# Patient Record
Sex: Male | Born: 1950 | Race: White | Hispanic: No | Marital: Married | Smoking: Never smoker
Health system: Southern US, Community
[De-identification: ages and names within clinical notes are randomized; demographics above are authoritative.]

## PROBLEM LIST (undated history)

## (undated) DIAGNOSIS — I10 Essential (primary) hypertension: Secondary | ICD-10-CM

## (undated) DIAGNOSIS — E785 Hyperlipidemia, unspecified: Secondary | ICD-10-CM

## (undated) DIAGNOSIS — E119 Type 2 diabetes mellitus without complications: Secondary | ICD-10-CM

---

## 2001-10-03 ENCOUNTER — Emergency Department (HOSPITAL_COMMUNITY): Admission: EM | Admit: 2001-10-03 | Discharge: 2001-10-03 | Payer: Self-pay

## 2015-05-16 DIAGNOSIS — E669 Obesity, unspecified: Secondary | ICD-10-CM | POA: Diagnosis not present

## 2015-05-16 DIAGNOSIS — M1990 Unspecified osteoarthritis, unspecified site: Secondary | ICD-10-CM | POA: Diagnosis not present

## 2015-05-16 DIAGNOSIS — E7800 Pure hypercholesterolemia, unspecified: Secondary | ICD-10-CM | POA: Diagnosis not present

## 2015-05-16 DIAGNOSIS — R739 Hyperglycemia, unspecified: Secondary | ICD-10-CM | POA: Diagnosis not present

## 2015-05-16 DIAGNOSIS — I10 Essential (primary) hypertension: Secondary | ICD-10-CM | POA: Diagnosis not present

## 2015-07-12 DIAGNOSIS — M1712 Unilateral primary osteoarthritis, left knee: Secondary | ICD-10-CM | POA: Diagnosis not present

## 2015-07-12 DIAGNOSIS — M1711 Unilateral primary osteoarthritis, right knee: Secondary | ICD-10-CM | POA: Diagnosis not present

## 2015-07-12 DIAGNOSIS — M170 Bilateral primary osteoarthritis of knee: Secondary | ICD-10-CM | POA: Diagnosis not present

## 2015-07-18 DIAGNOSIS — I10 Essential (primary) hypertension: Secondary | ICD-10-CM | POA: Diagnosis not present

## 2015-07-18 DIAGNOSIS — E7800 Pure hypercholesterolemia, unspecified: Secondary | ICD-10-CM | POA: Diagnosis not present

## 2015-07-18 DIAGNOSIS — E119 Type 2 diabetes mellitus without complications: Secondary | ICD-10-CM | POA: Diagnosis not present

## 2015-07-18 DIAGNOSIS — Z7984 Long term (current) use of oral hypoglycemic drugs: Secondary | ICD-10-CM | POA: Diagnosis not present

## 2015-07-18 DIAGNOSIS — M1712 Unilateral primary osteoarthritis, left knee: Secondary | ICD-10-CM | POA: Diagnosis not present

## 2015-09-11 DIAGNOSIS — Z7984 Long term (current) use of oral hypoglycemic drugs: Secondary | ICD-10-CM | POA: Diagnosis not present

## 2015-09-11 DIAGNOSIS — E119 Type 2 diabetes mellitus without complications: Secondary | ICD-10-CM | POA: Diagnosis not present

## 2015-09-11 DIAGNOSIS — M1712 Unilateral primary osteoarthritis, left knee: Secondary | ICD-10-CM | POA: Diagnosis not present

## 2015-09-11 DIAGNOSIS — E7800 Pure hypercholesterolemia, unspecified: Secondary | ICD-10-CM | POA: Diagnosis not present

## 2015-09-11 DIAGNOSIS — I10 Essential (primary) hypertension: Secondary | ICD-10-CM | POA: Diagnosis not present

## 2016-01-07 DIAGNOSIS — K5900 Constipation, unspecified: Secondary | ICD-10-CM | POA: Diagnosis not present

## 2016-01-13 DIAGNOSIS — E119 Type 2 diabetes mellitus without complications: Secondary | ICD-10-CM | POA: Diagnosis not present

## 2016-01-13 DIAGNOSIS — Z7984 Long term (current) use of oral hypoglycemic drugs: Secondary | ICD-10-CM | POA: Diagnosis not present

## 2016-01-13 DIAGNOSIS — K5909 Other constipation: Secondary | ICD-10-CM | POA: Diagnosis not present

## 2016-01-13 DIAGNOSIS — Z23 Encounter for immunization: Secondary | ICD-10-CM | POA: Diagnosis not present

## 2016-01-13 DIAGNOSIS — Z125 Encounter for screening for malignant neoplasm of prostate: Secondary | ICD-10-CM | POA: Diagnosis not present

## 2016-01-13 DIAGNOSIS — E7800 Pure hypercholesterolemia, unspecified: Secondary | ICD-10-CM | POA: Diagnosis not present

## 2016-01-13 DIAGNOSIS — M1712 Unilateral primary osteoarthritis, left knee: Secondary | ICD-10-CM | POA: Diagnosis not present

## 2016-01-13 DIAGNOSIS — I10 Essential (primary) hypertension: Secondary | ICD-10-CM | POA: Diagnosis not present

## 2016-05-14 DIAGNOSIS — I10 Essential (primary) hypertension: Secondary | ICD-10-CM | POA: Diagnosis not present

## 2016-05-14 DIAGNOSIS — E119 Type 2 diabetes mellitus without complications: Secondary | ICD-10-CM | POA: Diagnosis not present

## 2016-05-14 DIAGNOSIS — Z23 Encounter for immunization: Secondary | ICD-10-CM | POA: Diagnosis not present

## 2016-05-14 DIAGNOSIS — E7800 Pure hypercholesterolemia, unspecified: Secondary | ICD-10-CM | POA: Diagnosis not present

## 2016-11-12 DIAGNOSIS — E119 Type 2 diabetes mellitus without complications: Secondary | ICD-10-CM | POA: Diagnosis not present

## 2016-11-12 DIAGNOSIS — Z6834 Body mass index (BMI) 34.0-34.9, adult: Secondary | ICD-10-CM | POA: Diagnosis not present

## 2016-11-12 DIAGNOSIS — E7800 Pure hypercholesterolemia, unspecified: Secondary | ICD-10-CM | POA: Diagnosis not present

## 2016-11-12 DIAGNOSIS — I10 Essential (primary) hypertension: Secondary | ICD-10-CM | POA: Diagnosis not present

## 2016-11-17 DIAGNOSIS — E119 Type 2 diabetes mellitus without complications: Secondary | ICD-10-CM | POA: Diagnosis not present

## 2016-11-17 DIAGNOSIS — I10 Essential (primary) hypertension: Secondary | ICD-10-CM | POA: Diagnosis not present

## 2016-11-17 DIAGNOSIS — Z23 Encounter for immunization: Secondary | ICD-10-CM | POA: Diagnosis not present

## 2016-11-17 DIAGNOSIS — Z6835 Body mass index (BMI) 35.0-35.9, adult: Secondary | ICD-10-CM | POA: Diagnosis not present

## 2016-12-16 DIAGNOSIS — Z6835 Body mass index (BMI) 35.0-35.9, adult: Secondary | ICD-10-CM | POA: Diagnosis not present

## 2016-12-16 DIAGNOSIS — Z23 Encounter for immunization: Secondary | ICD-10-CM | POA: Diagnosis not present

## 2016-12-16 DIAGNOSIS — E119 Type 2 diabetes mellitus without complications: Secondary | ICD-10-CM | POA: Diagnosis not present

## 2016-12-16 DIAGNOSIS — I10 Essential (primary) hypertension: Secondary | ICD-10-CM | POA: Diagnosis not present

## 2016-12-18 DIAGNOSIS — E119 Type 2 diabetes mellitus without complications: Secondary | ICD-10-CM | POA: Diagnosis not present

## 2016-12-18 DIAGNOSIS — I10 Essential (primary) hypertension: Secondary | ICD-10-CM | POA: Diagnosis not present

## 2016-12-18 DIAGNOSIS — Z6832 Body mass index (BMI) 32.0-32.9, adult: Secondary | ICD-10-CM | POA: Diagnosis not present

## 2016-12-18 DIAGNOSIS — E669 Obesity, unspecified: Secondary | ICD-10-CM | POA: Diagnosis not present

## 2017-03-14 DIAGNOSIS — H9201 Otalgia, right ear: Secondary | ICD-10-CM | POA: Diagnosis not present

## 2017-03-14 DIAGNOSIS — K0510 Chronic gingivitis, plaque induced: Secondary | ICD-10-CM | POA: Diagnosis not present

## 2017-06-02 DIAGNOSIS — H1031 Unspecified acute conjunctivitis, right eye: Secondary | ICD-10-CM | POA: Diagnosis not present

## 2017-06-09 DIAGNOSIS — E119 Type 2 diabetes mellitus without complications: Secondary | ICD-10-CM | POA: Diagnosis not present

## 2017-06-09 DIAGNOSIS — E7800 Pure hypercholesterolemia, unspecified: Secondary | ICD-10-CM | POA: Diagnosis not present

## 2017-06-09 DIAGNOSIS — R748 Abnormal levels of other serum enzymes: Secondary | ICD-10-CM | POA: Diagnosis not present

## 2017-06-09 DIAGNOSIS — I10 Essential (primary) hypertension: Secondary | ICD-10-CM | POA: Diagnosis not present

## 2017-06-10 DIAGNOSIS — I10 Essential (primary) hypertension: Secondary | ICD-10-CM | POA: Diagnosis not present

## 2017-06-10 DIAGNOSIS — E119 Type 2 diabetes mellitus without complications: Secondary | ICD-10-CM | POA: Diagnosis not present

## 2017-06-10 DIAGNOSIS — E7800 Pure hypercholesterolemia, unspecified: Secondary | ICD-10-CM | POA: Diagnosis not present

## 2017-06-10 DIAGNOSIS — E669 Obesity, unspecified: Secondary | ICD-10-CM | POA: Diagnosis not present

## 2017-11-23 DIAGNOSIS — H524 Presbyopia: Secondary | ICD-10-CM | POA: Diagnosis not present

## 2017-12-13 DIAGNOSIS — E119 Type 2 diabetes mellitus without complications: Secondary | ICD-10-CM | POA: Diagnosis not present

## 2017-12-13 DIAGNOSIS — E7800 Pure hypercholesterolemia, unspecified: Secondary | ICD-10-CM | POA: Diagnosis not present

## 2017-12-13 DIAGNOSIS — Z1159 Encounter for screening for other viral diseases: Secondary | ICD-10-CM | POA: Diagnosis not present

## 2017-12-13 DIAGNOSIS — I10 Essential (primary) hypertension: Secondary | ICD-10-CM | POA: Diagnosis not present

## 2018-06-16 DIAGNOSIS — E119 Type 2 diabetes mellitus without complications: Secondary | ICD-10-CM | POA: Diagnosis not present

## 2018-06-16 DIAGNOSIS — E7800 Pure hypercholesterolemia, unspecified: Secondary | ICD-10-CM | POA: Diagnosis not present

## 2018-06-16 DIAGNOSIS — I10 Essential (primary) hypertension: Secondary | ICD-10-CM | POA: Diagnosis not present

## 2019-04-06 ENCOUNTER — Ambulatory Visit: Payer: Medicare Other

## 2019-04-23 ENCOUNTER — Ambulatory Visit: Payer: Medicare Other

## 2020-03-14 DIAGNOSIS — Z7984 Long term (current) use of oral hypoglycemic drugs: Secondary | ICD-10-CM | POA: Diagnosis not present

## 2020-03-14 DIAGNOSIS — E7800 Pure hypercholesterolemia, unspecified: Secondary | ICD-10-CM | POA: Diagnosis not present

## 2020-03-14 DIAGNOSIS — E1165 Type 2 diabetes mellitus with hyperglycemia: Secondary | ICD-10-CM | POA: Diagnosis not present

## 2020-03-14 DIAGNOSIS — I10 Essential (primary) hypertension: Secondary | ICD-10-CM | POA: Diagnosis not present

## 2020-03-14 DIAGNOSIS — M1712 Unilateral primary osteoarthritis, left knee: Secondary | ICD-10-CM | POA: Diagnosis not present

## 2020-07-15 DIAGNOSIS — Z1211 Encounter for screening for malignant neoplasm of colon: Secondary | ICD-10-CM | POA: Diagnosis not present

## 2020-07-15 DIAGNOSIS — M1712 Unilateral primary osteoarthritis, left knee: Secondary | ICD-10-CM | POA: Diagnosis not present

## 2020-07-15 DIAGNOSIS — I10 Essential (primary) hypertension: Secondary | ICD-10-CM | POA: Diagnosis not present

## 2020-07-15 DIAGNOSIS — E7800 Pure hypercholesterolemia, unspecified: Secondary | ICD-10-CM | POA: Diagnosis not present

## 2020-07-15 DIAGNOSIS — E1165 Type 2 diabetes mellitus with hyperglycemia: Secondary | ICD-10-CM | POA: Diagnosis not present

## 2020-08-13 DIAGNOSIS — M1712 Unilateral primary osteoarthritis, left knee: Secondary | ICD-10-CM | POA: Diagnosis not present

## 2020-08-13 DIAGNOSIS — I10 Essential (primary) hypertension: Secondary | ICD-10-CM | POA: Diagnosis not present

## 2020-08-13 DIAGNOSIS — E1165 Type 2 diabetes mellitus with hyperglycemia: Secondary | ICD-10-CM | POA: Diagnosis not present

## 2020-08-13 DIAGNOSIS — E7800 Pure hypercholesterolemia, unspecified: Secondary | ICD-10-CM | POA: Diagnosis not present

## 2020-08-13 DIAGNOSIS — Z1211 Encounter for screening for malignant neoplasm of colon: Secondary | ICD-10-CM | POA: Diagnosis not present

## 2020-08-27 DIAGNOSIS — E1165 Type 2 diabetes mellitus with hyperglycemia: Secondary | ICD-10-CM | POA: Diagnosis not present

## 2020-09-03 DIAGNOSIS — M25561 Pain in right knee: Secondary | ICD-10-CM | POA: Diagnosis not present

## 2020-09-03 DIAGNOSIS — M25562 Pain in left knee: Secondary | ICD-10-CM | POA: Diagnosis not present

## 2020-09-06 ENCOUNTER — Other Ambulatory Visit: Payer: Self-pay | Admitting: Sports Medicine

## 2020-09-06 ENCOUNTER — Other Ambulatory Visit: Payer: Self-pay

## 2020-09-06 ENCOUNTER — Ambulatory Visit
Admission: RE | Admit: 2020-09-06 | Discharge: 2020-09-06 | Disposition: A | Discharge status: Home / Self Care | Payer: Medicare Other | Source: Ambulatory Visit | Attending: Sports Medicine | Admitting: Sports Medicine

## 2020-09-06 DIAGNOSIS — M25561 Pain in right knee: Secondary | ICD-10-CM | POA: Diagnosis not present

## 2020-09-06 DIAGNOSIS — M25562 Pain in left knee: Secondary | ICD-10-CM | POA: Diagnosis not present

## 2020-09-06 DIAGNOSIS — R52 Pain, unspecified: Secondary | ICD-10-CM

## 2020-09-13 DIAGNOSIS — H52203 Unspecified astigmatism, bilateral: Secondary | ICD-10-CM | POA: Diagnosis not present

## 2020-09-13 DIAGNOSIS — H5203 Hypermetropia, bilateral: Secondary | ICD-10-CM | POA: Diagnosis not present

## 2020-09-13 DIAGNOSIS — E119 Type 2 diabetes mellitus without complications: Secondary | ICD-10-CM | POA: Diagnosis not present

## 2020-09-19 DIAGNOSIS — E1165 Type 2 diabetes mellitus with hyperglycemia: Secondary | ICD-10-CM | POA: Diagnosis not present

## 2020-09-19 DIAGNOSIS — M1711 Unilateral primary osteoarthritis, right knee: Secondary | ICD-10-CM | POA: Diagnosis not present

## 2020-10-03 DIAGNOSIS — M1712 Unilateral primary osteoarthritis, left knee: Secondary | ICD-10-CM | POA: Diagnosis not present

## 2020-11-12 DIAGNOSIS — E7800 Pure hypercholesterolemia, unspecified: Secondary | ICD-10-CM | POA: Diagnosis not present

## 2020-11-12 DIAGNOSIS — E1165 Type 2 diabetes mellitus with hyperglycemia: Secondary | ICD-10-CM | POA: Diagnosis not present

## 2020-11-12 DIAGNOSIS — M1712 Unilateral primary osteoarthritis, left knee: Secondary | ICD-10-CM | POA: Diagnosis not present

## 2020-11-12 DIAGNOSIS — I10 Essential (primary) hypertension: Secondary | ICD-10-CM | POA: Diagnosis not present

## 2020-11-12 DIAGNOSIS — M1711 Unilateral primary osteoarthritis, right knee: Secondary | ICD-10-CM | POA: Diagnosis not present

## 2020-11-12 DIAGNOSIS — Z0000 Encounter for general adult medical examination without abnormal findings: Secondary | ICD-10-CM | POA: Diagnosis not present

## 2021-02-06 DIAGNOSIS — D128 Benign neoplasm of rectum: Secondary | ICD-10-CM | POA: Diagnosis not present

## 2021-02-06 DIAGNOSIS — Z1211 Encounter for screening for malignant neoplasm of colon: Secondary | ICD-10-CM | POA: Diagnosis not present

## 2021-02-10 DIAGNOSIS — D128 Benign neoplasm of rectum: Secondary | ICD-10-CM | POA: Diagnosis not present

## 2021-04-08 DIAGNOSIS — M1712 Unilateral primary osteoarthritis, left knee: Secondary | ICD-10-CM | POA: Diagnosis not present

## 2021-04-08 DIAGNOSIS — E1165 Type 2 diabetes mellitus with hyperglycemia: Secondary | ICD-10-CM | POA: Diagnosis not present

## 2021-04-08 DIAGNOSIS — M1711 Unilateral primary osteoarthritis, right knee: Secondary | ICD-10-CM | POA: Diagnosis not present

## 2021-04-08 DIAGNOSIS — I10 Essential (primary) hypertension: Secondary | ICD-10-CM | POA: Diagnosis not present

## 2021-04-08 DIAGNOSIS — E7800 Pure hypercholesterolemia, unspecified: Secondary | ICD-10-CM | POA: Diagnosis not present

## 2021-04-15 DIAGNOSIS — M25561 Pain in right knee: Secondary | ICD-10-CM | POA: Diagnosis not present

## 2021-04-15 DIAGNOSIS — M25562 Pain in left knee: Secondary | ICD-10-CM | POA: Diagnosis not present

## 2021-12-10 DIAGNOSIS — E7800 Pure hypercholesterolemia, unspecified: Secondary | ICD-10-CM | POA: Diagnosis not present

## 2021-12-10 DIAGNOSIS — E1165 Type 2 diabetes mellitus with hyperglycemia: Secondary | ICD-10-CM | POA: Diagnosis not present

## 2022-02-20 IMAGING — CR DG KNEE 3 VIEWS*R*
3 series · 3 of 3 positions shown · non-contrast
Comparison: None.

CLINICAL DATA: Bilateral knee pain

EXAM:
RIGHT KNEE - 3 VIEW

[x knee sunrise right]
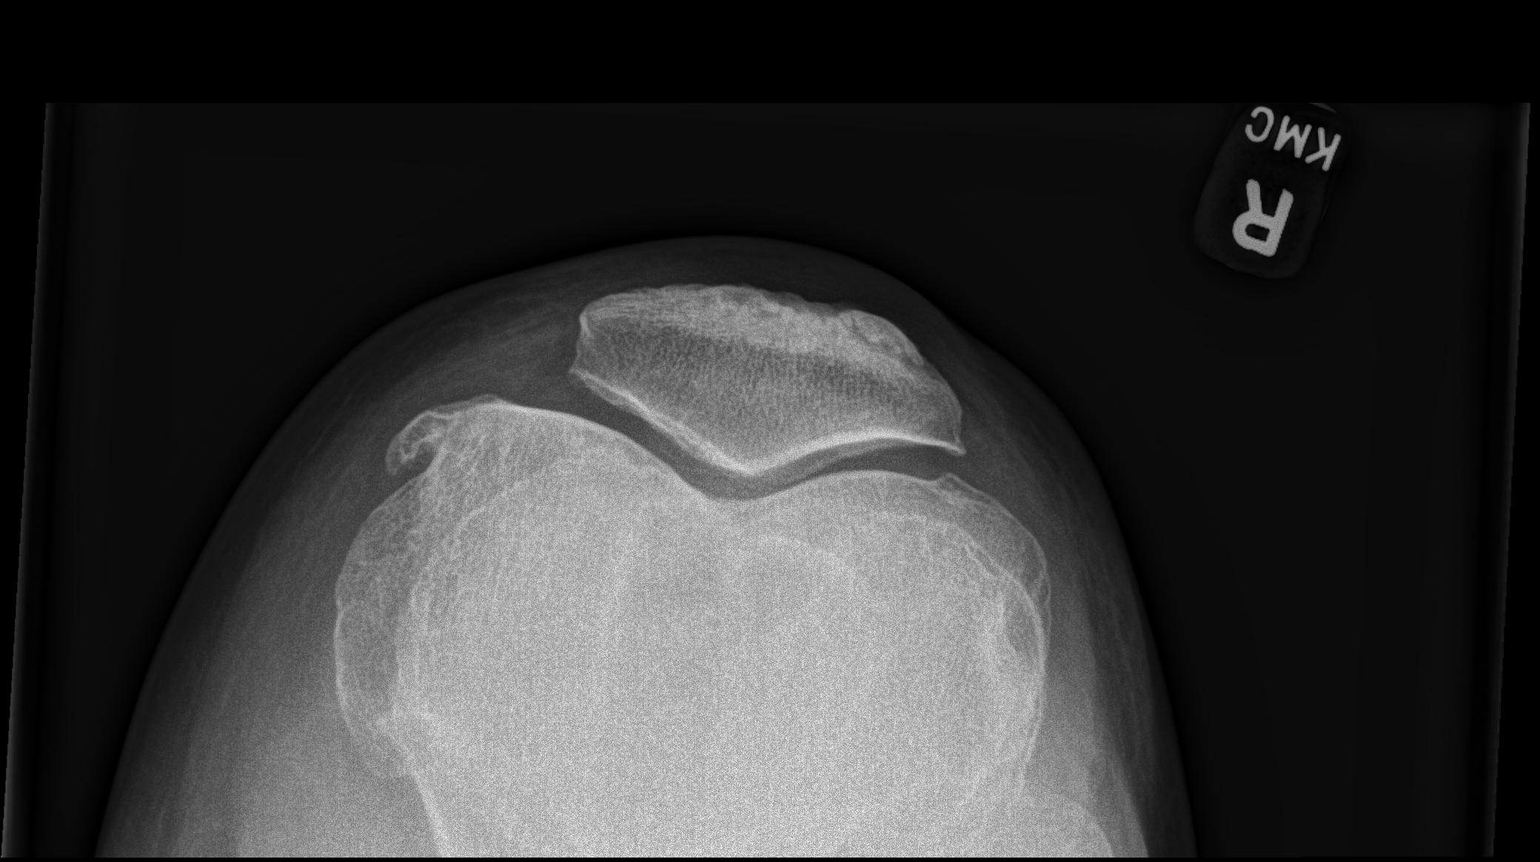

[w knee ap right]
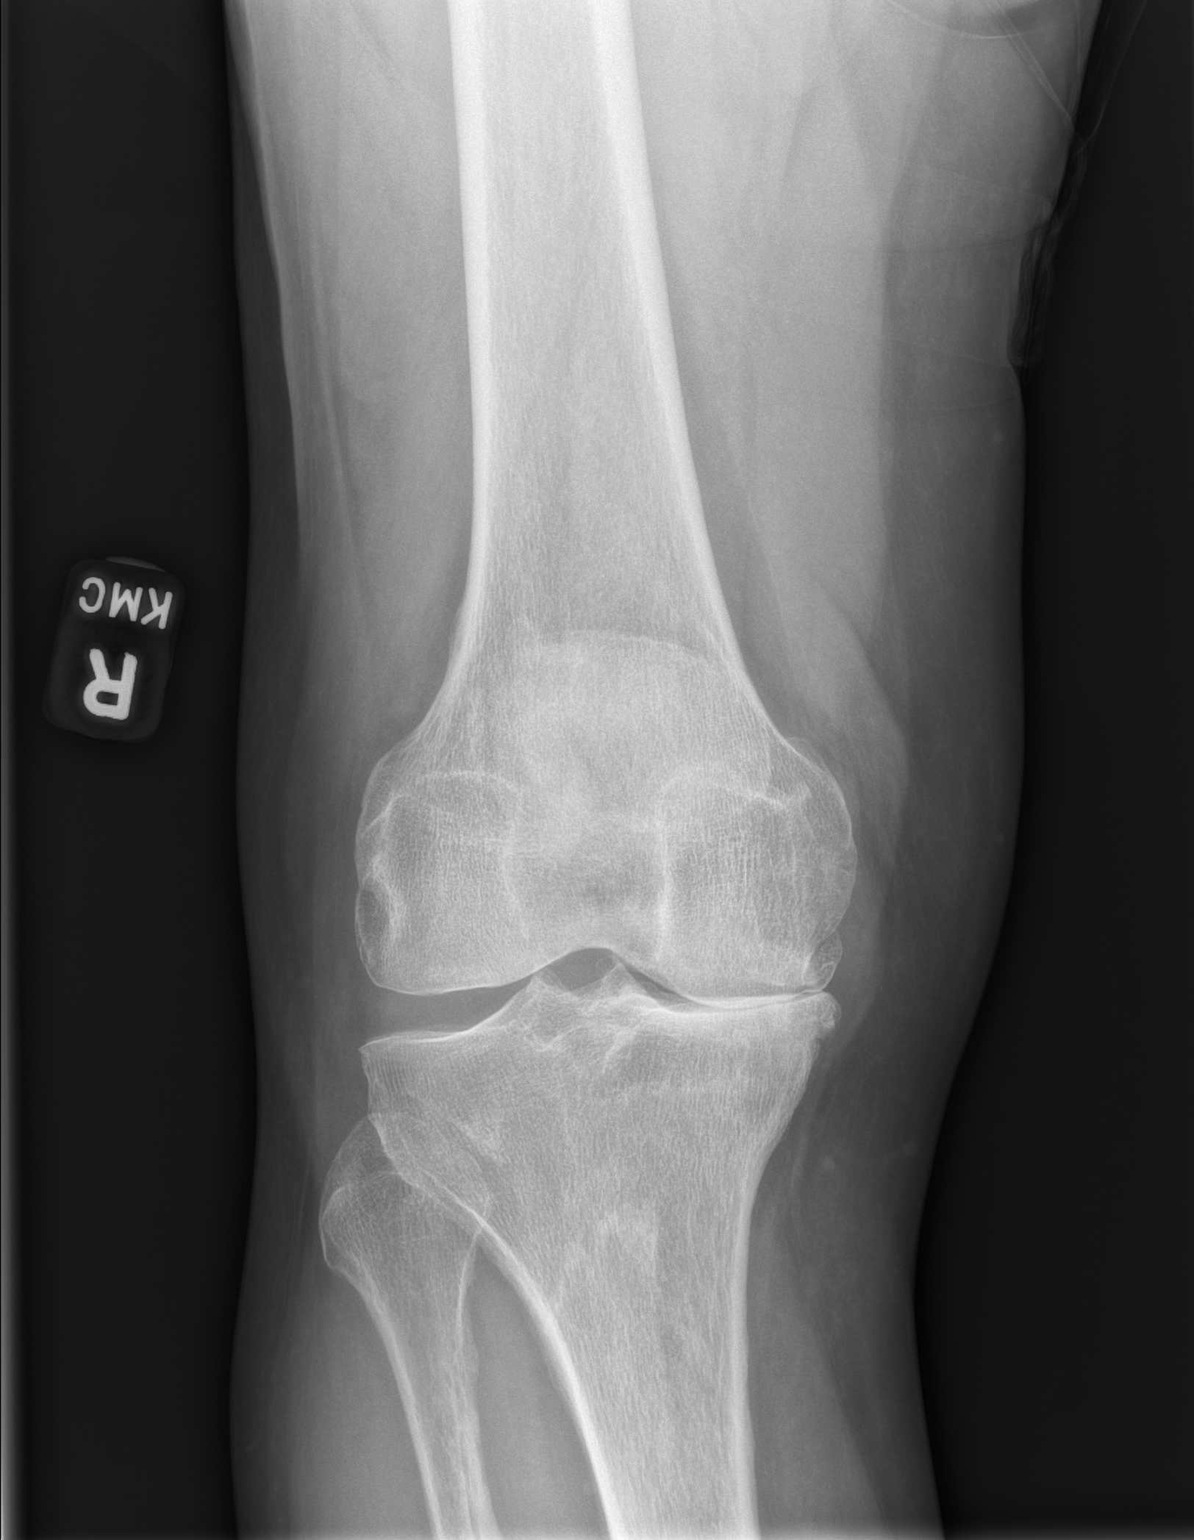

[w knee lat right]
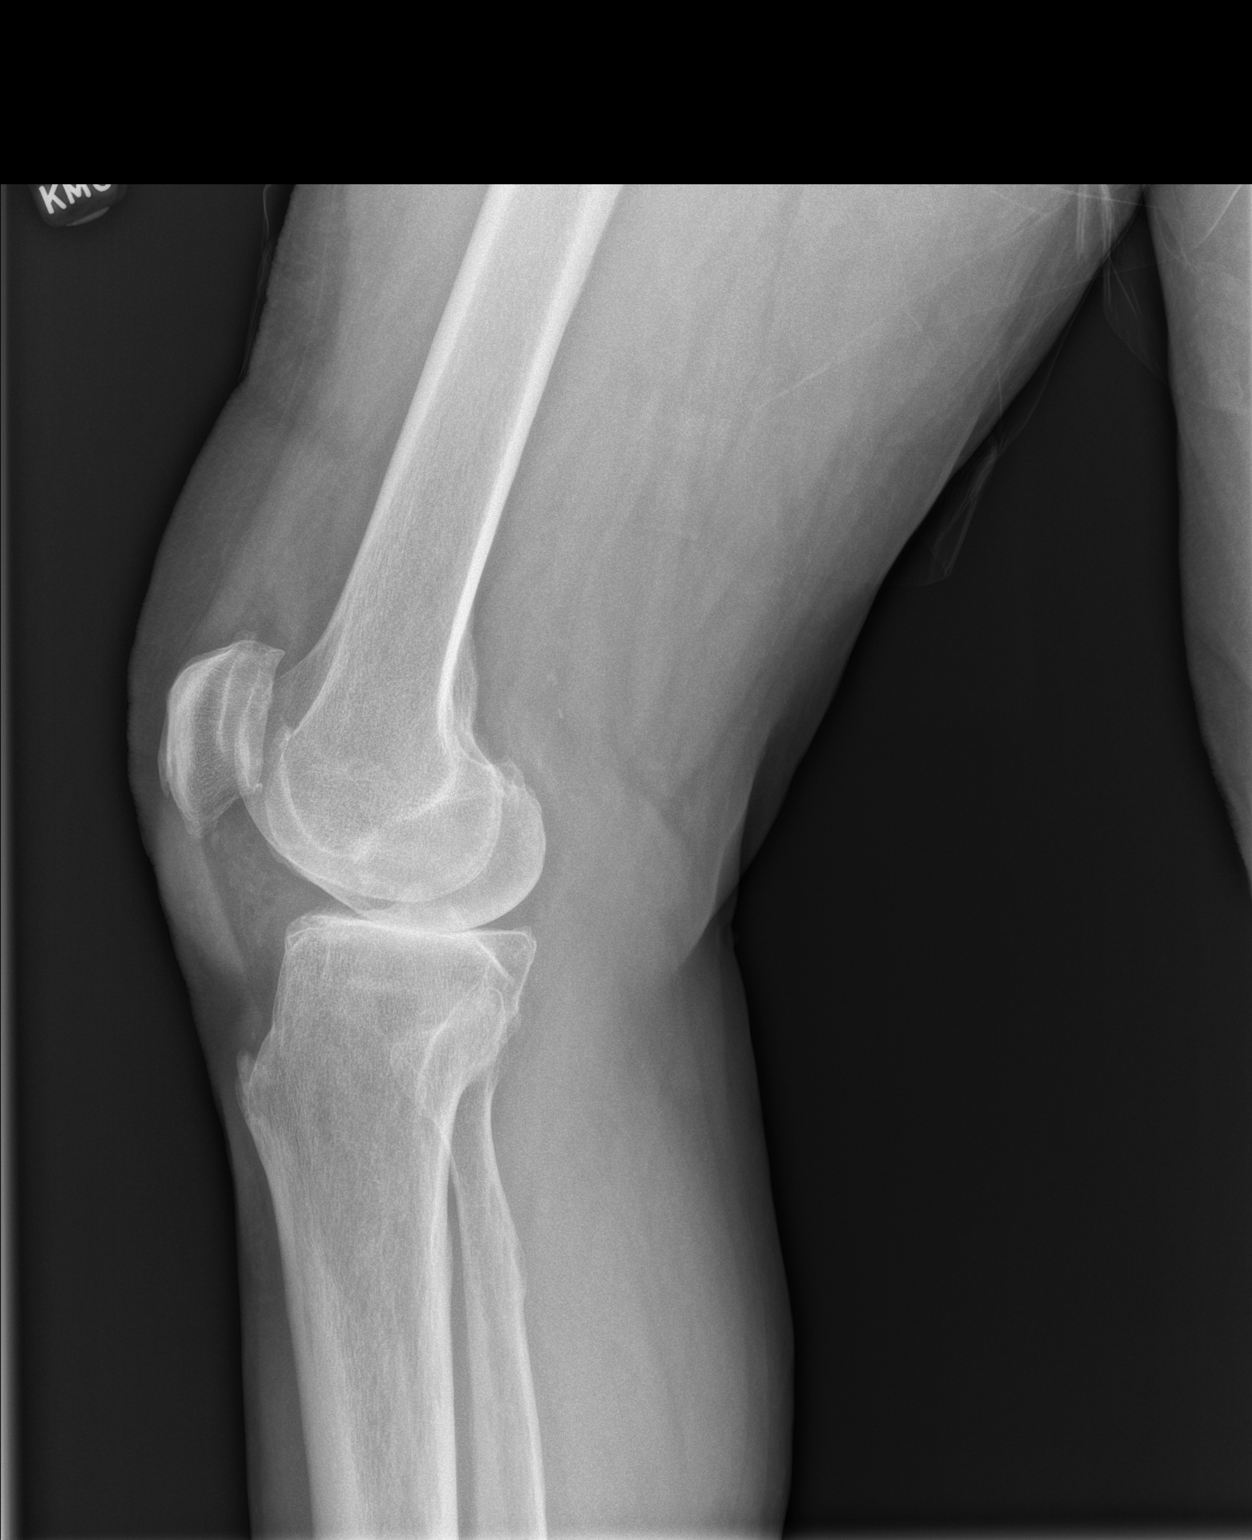

[3 of 3 positions shown; findings below may reference images not displayed]

FINDINGS: No evidence of fracture or dislocation. No definite joint effusion.
Severe medial tibiofemoral compartment joint space narrowing with
associated marginal osteophyte formation. Mild lateral tibiofemoral
and patellofemoral degenerative changes. No aggressive appearing
focal bone abnormality. Soft tissues are unremarkable.
IMPRESSION: No acute displaced fracture or dislocation in a patient with severe
medial tibiofemoral right knee osteoarthritis.

## 2022-03-04 DIAGNOSIS — R509 Fever, unspecified: Secondary | ICD-10-CM | POA: Diagnosis not present

## 2022-03-04 DIAGNOSIS — R051 Acute cough: Secondary | ICD-10-CM | POA: Diagnosis not present

## 2022-03-08 ENCOUNTER — Inpatient Hospital Stay (HOSPITAL_BASED_OUTPATIENT_CLINIC_OR_DEPARTMENT_OTHER)
Admission: EM | Admit: 2022-03-08 | Discharge: 2022-03-10 | DRG: 637 | Disposition: A | Discharge status: Home / Self Care | Payer: Medicare Other | Attending: Internal Medicine | Admitting: Internal Medicine

## 2022-03-08 ENCOUNTER — Emergency Department (HOSPITAL_BASED_OUTPATIENT_CLINIC_OR_DEPARTMENT_OTHER): Payer: Medicare Other

## 2022-03-08 ENCOUNTER — Encounter (HOSPITAL_COMMUNITY): Payer: Self-pay

## 2022-03-08 ENCOUNTER — Other Ambulatory Visit: Payer: Self-pay

## 2022-03-08 ENCOUNTER — Encounter (HOSPITAL_BASED_OUTPATIENT_CLINIC_OR_DEPARTMENT_OTHER): Payer: Self-pay | Admitting: Emergency Medicine

## 2022-03-08 DIAGNOSIS — U071 COVID-19: Secondary | ICD-10-CM | POA: Diagnosis not present

## 2022-03-08 DIAGNOSIS — J189 Pneumonia, unspecified organism: Secondary | ICD-10-CM | POA: Diagnosis present

## 2022-03-08 DIAGNOSIS — J121 Respiratory syncytial virus pneumonia: Secondary | ICD-10-CM | POA: Diagnosis present

## 2022-03-08 DIAGNOSIS — R079 Chest pain, unspecified: Secondary | ICD-10-CM | POA: Diagnosis not present

## 2022-03-08 DIAGNOSIS — I10 Essential (primary) hypertension: Secondary | ICD-10-CM | POA: Diagnosis not present

## 2022-03-08 DIAGNOSIS — B338 Other specified viral diseases: Secondary | ICD-10-CM | POA: Diagnosis not present

## 2022-03-08 DIAGNOSIS — R059 Cough, unspecified: Secondary | ICD-10-CM | POA: Diagnosis not present

## 2022-03-08 DIAGNOSIS — R7989 Other specified abnormal findings of blood chemistry: Secondary | ICD-10-CM

## 2022-03-08 DIAGNOSIS — E860 Dehydration: Secondary | ICD-10-CM | POA: Diagnosis not present

## 2022-03-08 DIAGNOSIS — E1010 Type 1 diabetes mellitus with ketoacidosis without coma: Secondary | ICD-10-CM

## 2022-03-08 DIAGNOSIS — E876 Hypokalemia: Secondary | ICD-10-CM | POA: Diagnosis not present

## 2022-03-08 DIAGNOSIS — R509 Fever, unspecified: Secondary | ICD-10-CM | POA: Diagnosis not present

## 2022-03-08 DIAGNOSIS — J1282 Pneumonia due to coronavirus disease 2019: Secondary | ICD-10-CM | POA: Diagnosis present

## 2022-03-08 DIAGNOSIS — E785 Hyperlipidemia, unspecified: Secondary | ICD-10-CM | POA: Diagnosis not present

## 2022-03-08 DIAGNOSIS — E1110 Type 2 diabetes mellitus with ketoacidosis without coma: Principal | ICD-10-CM | POA: Diagnosis present

## 2022-03-08 DIAGNOSIS — I493 Ventricular premature depolarization: Secondary | ICD-10-CM | POA: Diagnosis not present

## 2022-03-08 DIAGNOSIS — Z7984 Long term (current) use of oral hypoglycemic drugs: Secondary | ICD-10-CM | POA: Diagnosis not present

## 2022-03-08 DIAGNOSIS — Z79899 Other long term (current) drug therapy: Secondary | ICD-10-CM

## 2022-03-08 HISTORY — DX: Essential (primary) hypertension: I10

## 2022-03-08 HISTORY — DX: Hyperlipidemia, unspecified: E78.5

## 2022-03-08 HISTORY — DX: Type 2 diabetes mellitus without complications: E11.9

## 2022-03-08 LAB — BASIC METABOLIC PANEL
Anion gap: 23 — ABNORMAL HIGH (ref 5–15)
Anion gap: 11 (ref 5–15)
Anion gap: 10 (ref 5–15)
BUN: 17 mg/dL (ref 8–23)
BUN: 14 mg/dL (ref 8–23)
BUN: 12 mg/dL (ref 8–23)
CO2: 16 mmol/L — ABNORMAL LOW (ref 22–32)
CO2: 24 mmol/L (ref 22–32)
CO2: 23 mmol/L (ref 22–32)
Calcium: 9.9 mg/dL (ref 8.9–10.3)
Calcium: 8.8 mg/dL — ABNORMAL LOW (ref 8.9–10.3)
Calcium: 8.7 mg/dL — ABNORMAL LOW (ref 8.9–10.3)
Chloride: 96 mmol/L — ABNORMAL LOW (ref 98–111)
Chloride: 101 mmol/L (ref 98–111)
Chloride: 101 mmol/L (ref 98–111)
Creatinine, Ser: 1.06 mg/dL (ref 0.61–1.24)
Creatinine, Ser: 0.9 mg/dL (ref 0.61–1.24)
Creatinine, Ser: 0.84 mg/dL (ref 0.61–1.24)
GFR, Estimated: >60 mL/min (ref >60)
GFR, Estimated: >60 mL/min (ref >60)
GFR, Estimated: >60 mL/min (ref >60)
Glucose, Bld: 218 mg/dL — ABNORMAL HIGH (ref 70–99)
Glucose, Bld: 143 mg/dL — ABNORMAL HIGH (ref 70–99)
Glucose, Bld: 127 mg/dL — ABNORMAL HIGH (ref 70–99)
Potassium: 3.9 mmol/L (ref 3.5–5.1)
Potassium: 3.8 mmol/L (ref 3.5–5.1)
Potassium: 3.3 mmol/L — ABNORMAL LOW (ref 3.5–5.1)
Sodium: 135 mmol/L (ref 135–145)
Sodium: 136 mmol/L (ref 135–145)
Sodium: 134 mmol/L — ABNORMAL LOW (ref 135–145)

## 2022-03-08 LAB — LACTIC ACID, PLASMA
Lactic Acid, Venous: 1.4 mmol/L (ref 0.5–1.9)
Lactic Acid, Venous: 1.9 mmol/L (ref 0.5–1.9)

## 2022-03-08 LAB — PROCALCITONIN: Procalcitonin: 1 ng/mL

## 2022-03-08 LAB — I-STAT VENOUS BLOOD GAS, ED
Acid-base deficit: 3 mmol/L — ABNORMAL HIGH (ref 0.0–2.0)
Bicarbonate: 21.5 mmol/L (ref 20.0–28.0)
Calcium, Ion: 1.19 mmol/L (ref 1.15–1.40)
HCT: 40 % (ref 39.0–52.0)
Hemoglobin: 13.6 g/dL (ref 13.0–17.0)
O2 Saturation: 75 %
Patient temperature: 99.7
Potassium: 3.9 mmol/L (ref 3.5–5.1)
Sodium: 135 mmol/L (ref 135–145)
TCO2: 23 mmol/L (ref 22–32)
pCO2, Ven: 36.7 mmHg — ABNORMAL LOW (ref 44–60)
pH, Ven: 7.379 (ref 7.25–7.43)
pO2, Ven: 42 mmHg (ref 32–45)

## 2022-03-08 LAB — COMPREHENSIVE METABOLIC PANEL WITH GFR
ALT: 15 U/L (ref 0–44)
AST: 12 U/L — ABNORMAL LOW (ref 15–41)
Albumin: 3.1 g/dL — ABNORMAL LOW (ref 3.5–5.0)
Alkaline Phosphatase: 67 U/L (ref 38–126)
Anion gap: 9 (ref 5–15)
BUN: 14 mg/dL (ref 8–23)
CO2: 24 mmol/L (ref 22–32)
Calcium: 8.8 mg/dL — ABNORMAL LOW (ref 8.9–10.3)
Chloride: 105 mmol/L (ref 98–111)
Creatinine, Ser: 0.91 mg/dL (ref 0.61–1.24)
GFR, Estimated: >60 mL/min
Glucose, Bld: 115 mg/dL — ABNORMAL HIGH (ref 70–99)
Potassium: 3.7 mmol/L (ref 3.5–5.1)
Sodium: 138 mmol/L (ref 135–145)
Total Bilirubin: 1 mg/dL (ref 0.3–1.2)
Total Protein: 6.3 g/dL — ABNORMAL LOW (ref 6.5–8.1)

## 2022-03-08 LAB — GLUCOSE, CAPILLARY
Glucose-Capillary: 112 mg/dL — ABNORMAL HIGH (ref 70–99)
Glucose-Capillary: 119 mg/dL — ABNORMAL HIGH (ref 70–99)
Glucose-Capillary: 121 mg/dL — ABNORMAL HIGH (ref 70–99)
Glucose-Capillary: 122 mg/dL — ABNORMAL HIGH (ref 70–99)
Glucose-Capillary: 126 mg/dL — ABNORMAL HIGH (ref 70–99)
Glucose-Capillary: 129 mg/dL — ABNORMAL HIGH (ref 70–99)
Glucose-Capillary: 134 mg/dL — ABNORMAL HIGH (ref 70–99)
Glucose-Capillary: 139 mg/dL — ABNORMAL HIGH (ref 70–99)
Glucose-Capillary: 148 mg/dL — ABNORMAL HIGH (ref 70–99)
Glucose-Capillary: 150 mg/dL — ABNORMAL HIGH (ref 70–99)

## 2022-03-08 LAB — PHOSPHORUS: Phosphorus: 2.7 mg/dL (ref 2.5–4.6)

## 2022-03-08 LAB — HEPATIC FUNCTION PANEL
ALT: 12 U/L (ref 0–44)
AST: 11 U/L — ABNORMAL LOW (ref 15–41)
Albumin: 4.2 g/dL (ref 3.5–5.0)
Alkaline Phosphatase: 84 U/L (ref 38–126)
Bilirubin, Direct: 0.2 mg/dL (ref 0.0–0.2)
Indirect Bilirubin: 1.1 mg/dL — ABNORMAL HIGH (ref 0.3–0.9)
Total Bilirubin: 1.3 mg/dL — ABNORMAL HIGH (ref 0.3–1.2)
Total Protein: 7.9 g/dL (ref 6.5–8.1)

## 2022-03-08 LAB — CBC
HCT: 46.9 % (ref 39.0–52.0)
Hemoglobin: 14.9 g/dL (ref 13.0–17.0)
MCH: 28.1 pg (ref 26.0–34.0)
MCHC: 31.8 g/dL (ref 30.0–36.0)
MCV: 88.5 fL (ref 80.0–100.0)
Platelets: 295 10*3/uL (ref 150–400)
RBC: 5.3 MIL/uL (ref 4.22–5.81)
RDW: 12.9 % (ref 11.5–15.5)
WBC: 28.4 10*3/uL — ABNORMAL HIGH (ref 4.0–10.5)
nRBC: 0 % (ref 0.0–0.2)

## 2022-03-08 LAB — RESP PANEL BY RT-PCR (RSV, FLU A&B, COVID)  RVPGX2
Influenza A by PCR: NEGATIVE
Influenza B by PCR: NEGATIVE
Resp Syncytial Virus by PCR: POSITIVE — AB
SARS Coronavirus 2 by RT PCR: POSITIVE — AB

## 2022-03-08 LAB — TROPONIN I (HIGH SENSITIVITY)
Troponin I (High Sensitivity): 10 ng/L (ref <18)
Troponin I (High Sensitivity): 91 ng/L — ABNORMAL HIGH (ref <18)
Troponin I (High Sensitivity): 10 ng/L (ref <18)

## 2022-03-08 LAB — URINALYSIS, ROUTINE W REFLEX MICROSCOPIC
Bacteria, UA: NONE SEEN
Bilirubin Urine: NEGATIVE
Glucose, UA: >1000 mg/dL — AB
Ketones, ur: >80 mg/dL — AB
Leukocytes,Ua: NEGATIVE
Nitrite: NEGATIVE
Specific Gravity, Urine: 1.028 (ref 1.005–1.030)
pH: 5 (ref 5.0–8.0)

## 2022-03-08 LAB — BETA-HYDROXYBUTYRIC ACID
Beta-Hydroxybutyric Acid: 2.96 mmol/L — ABNORMAL HIGH (ref 0.05–0.27)
Beta-Hydroxybutyric Acid: 0.86 mmol/L — ABNORMAL HIGH (ref 0.05–0.27)
Beta-Hydroxybutyric Acid: 1.69 mmol/L — ABNORMAL HIGH (ref 0.05–0.27)

## 2022-03-08 LAB — CBG MONITORING, ED
Glucose-Capillary: 163 mg/dL — ABNORMAL HIGH (ref 70–99)
Glucose-Capillary: 121 mg/dL — ABNORMAL HIGH (ref 70–99)
Glucose-Capillary: 133 mg/dL — ABNORMAL HIGH (ref 70–99)

## 2022-03-08 LAB — DIFFERENTIAL
Abs Immature Granulocytes: 0.21 10*3/uL — ABNORMAL HIGH (ref 0.00–0.07)
Basophils Absolute: 0.1 10*3/uL (ref 0.0–0.1)
Basophils Relative: 0 %
Eosinophils Absolute: 0 10*3/uL (ref 0.0–0.5)
Eosinophils Relative: 0 %
Immature Granulocytes: 1 %
Lymphocytes Relative: 5 %
Lymphs Abs: 1.3 10*3/uL (ref 0.7–4.0)
Monocytes Absolute: 2.3 10*3/uL — ABNORMAL HIGH (ref 0.1–1.0)
Monocytes Relative: 8 %
Neutro Abs: 24.2 10*3/uL — ABNORMAL HIGH (ref 1.7–7.7)
Neutrophils Relative %: 86 %

## 2022-03-08 LAB — MRSA NEXT GEN BY PCR, NASAL: MRSA by PCR Next Gen: NOT DETECTED

## 2022-03-08 LAB — MAGNESIUM: Magnesium: 2 mg/dL (ref 1.7–2.4)

## 2022-03-08 LAB — BRAIN NATRIURETIC PEPTIDE: B Natriuretic Peptide: 128.5 pg/mL — ABNORMAL HIGH (ref 0.0–100.0)

## 2022-03-08 MED ORDER — POTASSIUM CHLORIDE 10 MEQ/100ML IV SOLN
10.0000 meq | INTRAVENOUS | Status: AC
  Administered 2022-03-08 (×2): 10 meq via INTRAVENOUS
  Filled 2022-03-08 (×2): qty 100

## 2022-03-08 MED ORDER — CHLORHEXIDINE GLUCONATE CLOTH 2 % EX PADS
6.0000 | MEDICATED_PAD | Freq: Every day | CUTANEOUS | Status: DC
  Administered 2022-03-08 – 2022-03-09 (×2): 6 via TOPICAL

## 2022-03-08 MED ORDER — SODIUM CHLORIDE 0.9 % IV SOLN
2.0000 g | Freq: Once | INTRAVENOUS | Status: AC
  Administered 2022-03-08: 2 g via INTRAVENOUS
  Filled 2022-03-08: qty 20

## 2022-03-08 MED ORDER — ACETAMINOPHEN 325 MG PO TABS: 650.0000 mg | ORAL_TABLET | Freq: Four times a day (QID) | ORAL | Status: DC | PRN

## 2022-03-08 MED ORDER — LACTATED RINGERS IV SOLN: INTRAVENOUS | Status: DC

## 2022-03-08 MED ORDER — ENOXAPARIN SODIUM 40 MG/0.4ML IJ SOSY
40.0000 mg | PREFILLED_SYRINGE | INTRAMUSCULAR | Status: DC
  Administered 2022-03-08 – 2022-03-09 (×2): 40 mg via SUBCUTANEOUS
  Filled 2022-03-08 (×2): qty 0.4

## 2022-03-08 MED ORDER — METOPROLOL TARTRATE 5 MG/5ML IV SOLN
5.0000 mg | Freq: Once | INTRAVENOUS | Status: AC
  Administered 2022-03-08: 5 mg via INTRAVENOUS
  Filled 2022-03-08: qty 5

## 2022-03-08 MED ORDER — DEXTROSE 50 % IV SOLN: 0.0000 mL | INTRAVENOUS | Status: DC | PRN

## 2022-03-08 MED ORDER — MAGNESIUM SULFATE 2 GM/50ML IV SOLN
2.0000 g | Freq: Once | INTRAVENOUS | Status: AC
  Administered 2022-03-08: 2 g via INTRAVENOUS
  Filled 2022-03-08: qty 50

## 2022-03-08 MED ORDER — NIRMATRELVIR/RITONAVIR (PAXLOVID)TABLET
3.0000 | ORAL_TABLET | Freq: Two times a day (BID) | ORAL | Status: DC
  Administered 2022-03-08 – 2022-03-10 (×4): 3 via ORAL
  Filled 2022-03-08: qty 30

## 2022-03-08 MED ORDER — SODIUM CHLORIDE 0.9 % IV SOLN
500.0000 mg | INTRAVENOUS | Status: AC
  Administered 2022-03-09 – 2022-03-10 (×2): 500 mg via INTRAVENOUS
  Filled 2022-03-08 (×2): qty 5

## 2022-03-08 MED ORDER — ONDANSETRON HCL 4 MG/2ML IJ SOLN: 4.0000 mg | Freq: Four times a day (QID) | INTRAMUSCULAR | Status: DC | PRN

## 2022-03-08 MED ORDER — SODIUM CHLORIDE 0.9 % IV SOLN
500.0000 mg | Freq: Once | INTRAVENOUS | Status: AC
  Administered 2022-03-08: 500 mg via INTRAVENOUS
  Filled 2022-03-08: qty 5

## 2022-03-08 MED ORDER — INSULIN REGULAR(HUMAN) IN NACL 100-0.9 UT/100ML-% IV SOLN
INTRAVENOUS | Status: DC
  Administered 2022-03-08: 4.2 [IU]/h via INTRAVENOUS
  Filled 2022-03-08: qty 100

## 2022-03-08 MED ORDER — ORAL CARE MOUTH RINSE: 15.0000 mL | OROMUCOSAL | Status: DC | PRN

## 2022-03-08 MED ORDER — ONDANSETRON HCL 4 MG PO TABS: 4.0000 mg | ORAL_TABLET | Freq: Four times a day (QID) | ORAL | Status: DC | PRN

## 2022-03-08 MED ORDER — INSULIN DETEMIR 100 UNIT/ML ~~LOC~~ SOLN
8.0000 [IU] | Freq: Once | SUBCUTANEOUS | Status: AC
  Administered 2022-03-08: 8 [IU] via SUBCUTANEOUS
  Filled 2022-03-08: qty 0.08

## 2022-03-08 MED ORDER — DEXTROSE IN LACTATED RINGERS 5 % IV SOLN: INTRAVENOUS | Status: DC

## 2022-03-08 MED ORDER — LACTATED RINGERS IV BOLUS
1000.0000 mL | Freq: Once | INTRAVENOUS | Status: AC
  Administered 2022-03-08: 1000 mL via INTRAVENOUS

## 2022-03-08 MED ORDER — SODIUM CHLORIDE 0.9 % IV SOLN
1.0000 g | INTRAVENOUS | Status: AC
  Administered 2022-03-09 – 2022-03-10 (×2): 1 g via INTRAVENOUS
  Filled 2022-03-08 (×2): qty 10

## 2022-03-08 MED ORDER — ACETAMINOPHEN 650 MG RE SUPP: 650.0000 mg | Freq: Four times a day (QID) | RECTAL | Status: DC | PRN

## 2022-03-08 MED ORDER — IOHEXOL 350 MG/ML SOLN
100.0000 mL | Freq: Once | INTRAVENOUS | Status: AC | PRN
  Administered 2022-03-08: 70 mL via INTRAVENOUS

## 2022-03-08 NOTE — H&P (Signed)
History and Physical    Patient: Rodney Mcbride WJX:[id number] DOB: [birth year] DOA: 03/08/2022 DOS: the patient was seen and examined on 03/08/2022 PCP: Vernie Shanks, MD (Inactive)  Patient coming from: Home  Chief Complaint:  Chief Complaint  Patient presents with   Chest Pain   HPI: Rodney Mcbride is a 71 y.o. male with medical history significant of type 2 diabetes, hyperlipidemia, hypertension, overweight who is coming to the emergency department due to mid chest pain, sharp in quality worsened by cough, fever, nasal and chest congestion for about a week.  He was diagnosed by his PCP with RSV.  He was negative for influenza and COVID-19 at that time.  Stated he persisted having fevers, chills, URI symptoms at home.  He started having pleuritic chest pain with cough several days ago that has gotten progressively worse. He denied hemoptysis.  No palpitations, diaphoresis, PND, orthopnea or pitting edema of the lower extremities.  No abdominal pain, nausea, emesis, diarrhea, constipation, melena or hematochezia.  No flank pain, dysuria, frequency or hematuria.  No polyuria, polydipsia, polyphagia or blurred vision.  He has not been eating much.  He only had a soup yesterday.  He has not eaten today.  ED course: Initial vital signs were temperature 99.7 F, pulse 125, respiration 18, BP 139/81 mmHg O2 sat 95% on room air.  The patient received IV fluid boluses, started on an insulin infusion, KCl 10 mEq IVPB x 2, azithromycin 500 mg IVPB and ceftriaxone 2 g IVPB.  Lab work: Urinalysis with glucosuria more than 1000, trace hemoglobinuria and trace proteinuria.  Ketonuria more than 80 mg/dL.  Coronavirus and RSV PCR positive.  Influenza PCR negative.  CBC showed a white count of 28.4, hemoglobin 14.9 g/dL platelets 295.  Troponin was 10 and then 191 and then 10 ng/L.  BNP 128.5 pg/mL.  Beta-hydroxybutyrate acid 2.96 mmol/L.  Venous pH was normal set for pCO2 of 36.7 mmHg and acid-base deficit at  3.0 mmol/L.  BMP showed a chloride of 96 and CO2 of 16 mmol/L with an anion gap of 23.  Glucose 218 mg/deciliter.  Normal renal function and rest of the electrolytes.  LFTs showed a total bilirubin of 1.3 mg deciliter, was otherwise unremarkable.  Lactic acid x 2 normal.  Imaging: Portable 1 view chest radiograph with no active disease.  CTA chest no PE, aneurysm or dissection.  There is a right hilar and mediastinal adenopathy which could be reactive.  Imaging follow-up recommended.  Dense alveolar consolidation, most likely pneumonia in the right upper lobe.  Follow-up recommended.  Nonspecific diffuse esophageal wall thickening for which endoscopic follow-up should be considered.   Review of Systems: As mentioned in the history of present illness. All other systems reviewed and are negative. Past Medical History:  Diagnosis Date   Diabetes mellitus without complication ([place])    Hyperlipemia    Hypertension    History reviewed. No pertinent surgical history. Social History:  reports that he has never smoked. He has never used smokeless tobacco. He reports that he does not drink alcohol and does not use drugs.  Not on File  History reviewed. No pertinent family history.  Prior to Admission medications   Not on File    Physical Exam: Vitals:   03/08/22 1130 03/08/22 1200 03/08/22 1230 03/08/22 1300  BP: (!) 142/81 138/82 (!) 147/75 (!) 145/82  Pulse: (!) 101 (!) 108 91 (!) 104  Resp: (!) 24 (!) 21 (!) 24 (!) 25  Temp:  SpO2: 92% 95% 93% 92%  Weight:      Height:       Physical Exam Vitals and nursing note reviewed.  Constitutional:      General: He is awake. He is not in acute distress.    Appearance: He is well-developed. He is ill-appearing.  HENT:     Head: Normocephalic.     Nose: No rhinorrhea.     Mouth/Throat:     Mouth: Mucous membranes are dry.  Eyes:     General: No scleral icterus.    Pupils: Pupils are equal, round, and reactive to light.  Neck:      Vascular: No JVD.  Cardiovascular:     Rate and Rhythm: Regular rhythm. Tachycardia present. Frequent Extrasystoles are present.    Heart sounds: S1 normal and S2 normal.  Pulmonary:     Effort: Tachypnea present. No accessory muscle usage.     Breath sounds: Rhonchi and rales present. No wheezing.  Abdominal:     General: Bowel sounds are normal. There is no distension.     Palpations: Abdomen is soft.     Tenderness: There is no abdominal tenderness. There is no guarding.  Musculoskeletal:     Cervical back: Neck supple.     Right lower leg: No edema.     Left lower leg: No edema.  Skin:    General: Skin is warm and dry.  Neurological:     General: No focal deficit present.     Mental Status: He is alert and oriented to person, place, and time.  Psychiatric:        Mood and Affect: Mood normal.        Behavior: Behavior normal. Behavior is cooperative.    Data Reviewed:  Results are pending, will review when available.  Assessment and Plan: Principal Problem:   DKA, type 2 ([place]) Secondary to fasting. Observation/stepdown. Keep NPO. Continue IV fluids. Continue insulin infusion. Monitor CBG closely. BMP every 4 hours. BHA every 8 hours. Replace electrolytes as needed. Consult diabetes coordinator. Transition to SQ insulin per Endo tool. Levemir x 1 dose tonight. Likely will not need long-term insulin. Resume oral meds once patient eating. CBG monitoring with RI SS.  Active Problems:   PVCs No precordial chest pain or palpitations. Metoprolol 5 mg IVP x 1 given Magnesium sulfate 2 g IVPB. Check echocardiogram. Continue DKA and infection treatment.    CAP (community acquired pneumonia)   RSV (respiratory syncytial virus pneumonia) Supplemental oxygen as needed. Scheduled and as needed bronchodilators. Continue ceftriaxone 1 g IVPB daily. Continue azithromycin 500 mg IVPB daily. Check strep pneumoniae urinary antigen. Check sputum Gram stain, culture and  sensitivity. Follow-up blood culture and sensitivity. Follow-up CBC and chemistry in the morning.    COVID-19 virus infection As above. Begin Paxlovid per pharmacy.    Hypertension Hold amlodipine due to dehydration/tachycardia. Monitor blood pressure closely.    Hyperlipidemia Resume rosuvastatin in a.m. if LFTs normal.    Hyperbilirubinemia In the setting of volume depletion and decreased oral intake. Follow-up level in AM.    Hypocalcemia Recheck calcium and albumin level in the morning.    Advance Care Planning:   Code Status: Full Code   Consults:   Family Communication:   Severity of Illness: The appropriate patient status for this patient is INPATIENT. Inpatient status is judged to be reasonable and necessary in order to provide the required intensity of service to ensure the patient's safety. The patient's presenting symptoms, physical exam findings,  and initial radiographic and laboratory data in the context of their chronic comorbidities is felt to place them at high risk for further clinical deterioration. Furthermore, it is not anticipated that the patient will be medically stable for discharge from the hospital within 2 midnights of admission.   * I certify that at the point of admission it is my clinical judgment that the patient will require inpatient hospital care spanning beyond 2 midnights from the point of admission due to high intensity of service, high risk for further deterioration and high frequency of surveillance required.*  Author: Reubin Milan, MD 03/08/2022 2:28 PM  For on call review [email or web address].   This document was prepared using Dragon voice recognition software and may contain some unintended transcription errors.

## 2022-03-08 NOTE — Progress Notes (Signed)
Plan of Care Note for accepted transfer   Patient: Rodney Mcbride MRN: [id number]   DOA: 03/08/2022  Facility requesting transfer: [facility]. Requesting Provider: Gareth Morgan, MD. Reason for transfer: Covid 19 and RSV Facility course:  Per Dr. Billy Fischer: Chief Complaint  Patient presents with   Chest Pain  Pratik Dalziel is a 71 y.o. male.   HPI: Has had a cold for the last week Cough, congestion Coughed up dark black brown this AM Fever 101 on Tuesday, went to [place], thinks that improved No shortness of breath but wife notes he looks more labored not gasping but thinks it is changed CP started this AM upon waking mostly middle of chest, like don't want to move, sharp pain, worse with deep breath not necessarily worse with movement. No vomiting, has had nausea, spitting up mucus with cold No leg pain or swelling   No hx of heart problems  Brain natriuretic peptide [[id number]] (Abnormal)   Collected: 03/08/22 0749   Updated: 03/08/22 0936   Specimen Type: Blood   Specimen Source: Vein    B Natriuretic Peptide 128.5 High  pg/mL  Lactic acid, plasma [[id number]]   Collected: 03/08/22 0843   Updated: 03/08/22 0925   Specimen Type: Blood    Lactic Acid, Venous 1.4 mmol/L  Hepatic function panel [[id number]] (Abnormal)   Collected: 03/08/22 0749   Updated: 03/08/22 0919   Specimen Type: Blood   Specimen Source: Vein    Total Protein 7.9 g/dL   Albumin 4.2 g/dL   AST 11 Low  U/L   ALT 12 U/L   Alkaline Phosphatase 84 U/L   Total Bilirubin 1.3 High  mg/dL   Bilirubin, Direct 0.2 mg/dL   Indirect Bilirubin 1.1 High  mg/dL  Urinalysis, Routine w reflex microscopic Urine, Clean Catch [[id number]] (Abnormal)   Collected: 03/08/22 0843   Updated: 03/08/22 0912   Specimen Source: Urine, Clean Catch    Color, Urine YELLOW   APPearance CLEAR   Specific Gravity, Urine 1.028   pH 5.0   Glucose, UA >1,000 Abnormal  mg/dL   Hgb urine dipstick TRACE Abnormal    Bilirubin Urine  NEGATIVE   Ketones, ur >80 Abnormal  mg/dL   Protein, ur TRACE Abnormal  mg/dL   Nitrite NEGATIVE   Leukocytes,Ua NEGATIVE   RBC / HPF 0-5 RBC/hpf   WBC, UA 0-5 WBC/hpf   Bacteria, UA NONE SEEN   Squamous Epithelial / LPF 0-5 /HPF  Differential [[id number]] (Abnormal)   Collected: 03/08/22 0749   Updated: 03/08/22 0903   Specimen Type: Blood   Specimen Source: Vein    Neutrophils Relative % 86 %   Neutro Abs 24.2 High  K/uL   Lymphocytes Relative 5 %   Lymphs Abs 1.3 K/uL   Monocytes Relative 8 %   Monocytes Absolute 2.3 High  K/uL   Eosinophils Relative 0 %   Eosinophils Absolute 0.0 K/uL   Basophils Relative 0 %   Basophils Absolute 0.1 K/uL   Immature Granulocytes 1 %   Abs Immature Granulocytes 0.21 High  K/uL   Basic metabolic panel [[id number]] (Abnormal)   Collected: 03/08/22 0749   Updated: 03/08/22 0839   Specimen Type: Blood   Specimen Source: Vein    Sodium 135 mmol/L   Potassium 3.9 mmol/L   Chloride 96 Low  mmol/L   CO2 16 Low  mmol/L   Glucose, Bld 218 High  mg/dL   BUN 17 mg/dL   Creatinine, Ser 1.06 mg/dL   Calcium 9.9 mg/dL  GFR, Estimated >60 mL/min   Anion gap 23 High   Resp panel by RT-PCR (RSV, Flu A&B, Covid) Anterior Nasal Swab [[id number]] (Abnormal)   Collected: 03/08/22 0750   Updated: 03/08/22 0833   Specimen Source: Anterior Nasal Swab    SARS Coronavirus 2 by RT PCR POSITIVE Abnormal    Influenza A by PCR NEGATIVE   Influenza B by PCR NEGATIVE   Resp Syncytial Virus by PCR POSITIVE Abnormal   Troponin I (High Sensitivity) [[id number]]   Collected: 03/08/22 0749   Updated: 03/08/22 0824   Specimen Source: Vein    Troponin I (High Sensitivity) 10 ng/L  CBC [[id number]] (Abnormal)   Collected: 03/08/22 0749   Updated: 03/08/22 0755   Specimen Type: Blood   Specimen Source: Vein    WBC 28.4 High  K/uL   RBC 5.30 MIL/uL   Hemoglobin 14.9 g/dL   HCT 46.9 %   MCV 88.5 fL   MCH 28.1 pg   MCHC 31.8 g/dL   RDW 12.9 %   Platelets 295  K/uL   nRBC 0.0 %   Imaging: PORTABLE CHEST 1 VIEW COMPARISON:  None Available.   FINDINGS: The heart size and mediastinal contours are within normal limits. Aortic atherosclerotic calcification incidentally noted. Mild elevation of left hemidiaphragm is noted. Both lungs are clear.   IMPRESSION: No active disease.   Electronically Signed   By: Marlaine Hind M.D.   On: 03/08/2022 08:18  CTA chest: IMPRESSION: 1. No PE, aneurysm or dissection. 2. Right hilar and mediastinal adenopathy which could be reactive, and follow up recommended. 3. Dense alveolar consolidation, most likely pneumonia, right upper lobe. Follow up Recommended to exclude underlying lesions. 4. Nonspecific diffuse esophageal wall thickening for which endoscopic follow-up should be considered.   Electronically Signed   By: Sammie Bench M.D.   On: 03/08/2022 09:43  Plan of care: The patient is accepted for admission to Progressive unit, at [facility].  Admitted for DKA.  He is being covered for CAP with ceftriaxone and azithromycin.  Troponin levels to be trended.   Author: Reubin Milan, MD 03/08/2022  Check [email or web address] for on-call coverage.  Nursing staff, Please call [place] number on Amion as soon as patient's arrival, so appropriate admitting provider can evaluate the pt.

## 2022-03-08 NOTE — ED Notes (Signed)
Patient ambulatory to restroom with steady gait. Returned to room and placed on continuous vital monitoring.

## 2022-03-08 NOTE — ED Provider Notes (Signed)
[facility] EMERGENCY DEPT Provider Note   CSN: [id number] Arrival date & time: 03/08/22  0732     History  Chief Complaint  Patient presents with   Chest Pain    Rodney Mcbride is a 71 y.o. male.  HPI     Has had a cold for the last week Cough, congestion Coughed up dark black brown this AM Fever 101 on Tuesday, went to [place], thinks that improved No shortness of breath but wife notes he looks more labored not gasping but thinks it is changed CP started this AM upon waking mostly middle of chest, like don't want to move, sharp pain, worse with deep breath not necessarily worse with movement. No vomiting, has had nausea, spitting up mucus with cold No leg pain or swelling  No hx of heart problems    Past Medical History:  Diagnosis Date   Diabetes mellitus without complication (HCC)    Hyperlipemia    Hypertension      Home Medications Prior to Admission medications   Medication Sig Start Date End Date Taking? Authorizing Provider  amLODipine (NORVASC) 5 MG tablet Take 5 mg by mouth daily.    [provider]  JARDIANCE 25 MG TABS tablet Take 25 mg by mouth daily.    [provider]  metFORMIN (GLUCOPHAGE) 500 MG tablet Take 1,000 mg by mouth 2 (two) times daily with a meal.    [provider]  rosuvastatin (CRESTOR) 10 MG tablet Take 10 mg by mouth daily.    [provider]      Allergies    Patient has no known allergies.    Review of Systems   Review of Systems  Physical Exam Updated Vital Signs BP (!) 149/68 (BP Location: Right Arm)   Pulse (!) 44   Temp 97.7 F (36.5 C) (Oral)   Resp 16   Ht 5\' 7"  (1.702 m)   Wt 84.8 kg   SpO2 96%   BMI 29.28 kg/m  Physical Exam Vitals and nursing note reviewed.  Constitutional:      General: He is not in acute distress.    Appearance: He is well-developed. He is not diaphoretic.  HENT:     Head: Normocephalic and atraumatic.  Eyes:      Conjunctiva/sclera: Conjunctivae normal.  Cardiovascular:     Rate and Rhythm: Regular rhythm. Tachycardia present.     Heart sounds: Normal heart sounds. No murmur heard.    No friction rub. No gallop.  Pulmonary:     Effort: Pulmonary effort is normal. No respiratory distress.     Breath sounds: Normal breath sounds. No wheezing or rales.  Abdominal:     General: There is no distension.     Palpations: Abdomen is soft.     Tenderness: There is no abdominal tenderness. There is no guarding.  Musculoskeletal:     Cervical back: Normal range of motion.  Skin:    General: Skin is warm and dry.  Neurological:     Mental Status: He is alert and oriented to person, place, and time.     ED Results / Procedures / Treatments   Labs (all labs ordered are listed, but only abnormal results are displayed) Labs Reviewed  RESP PANEL BY RT-PCR (RSV, FLU A&B, COVID)  RVPGX2 - Abnormal; Notable for the following components:      Result Value   SARS Coronavirus 2 by RT PCR POSITIVE (*)    Resp Syncytial Virus by PCR POSITIVE (*)  All other components within normal limits  BASIC METABOLIC PANEL - Abnormal; Notable for the following components:   Chloride 96 (*)    CO2 16 (*)    Glucose, Bld 218 (*)    Anion gap 23 (*)    All other components within normal limits  CBC - Abnormal; Notable for the following components:   WBC 28.4 (*)    All other components within normal limits  DIFFERENTIAL - Abnormal; Notable for the following components:   Neutro Abs 24.2 (*)    Monocytes Absolute 2.3 (*)    Abs Immature Granulocytes 0.21 (*)    All other components within normal limits  HEPATIC FUNCTION PANEL - Abnormal; Notable for the following components:   AST 11 (*)    Total Bilirubin 1.3 (*)    Indirect Bilirubin 1.1 (*)    All other components within normal limits  BRAIN NATRIURETIC PEPTIDE - Abnormal; Notable for the following components:   B Natriuretic Peptide 128.5 (*)    All other  components within normal limits  URINALYSIS, ROUTINE W REFLEX MICROSCOPIC - Abnormal; Notable for the following components:   Glucose, UA >1,000 (*)    Hgb urine dipstick TRACE (*)    Ketones, ur >80 (*)    Protein, ur TRACE (*)    All other components within normal limits  BETA-HYDROXYBUTYRIC ACID - Abnormal; Notable for the following components:   Beta-Hydroxybutyric Acid 2.96 (*)    All other components within normal limits  BASIC METABOLIC PANEL - Abnormal; Notable for the following components:   Glucose, Bld 143 (*)    Calcium 8.8 (*)    All other components within normal limits  GLUCOSE, CAPILLARY - Abnormal; Notable for the following components:   Glucose-Capillary 148 (*)    All other components within normal limits  GLUCOSE, CAPILLARY - Abnormal; Notable for the following components:   Glucose-Capillary 126 (*)    All other components within normal limits  COMPREHENSIVE METABOLIC PANEL - Abnormal; Notable for the following components:   Glucose, Bld 115 (*)    Calcium 8.8 (*)    Total Protein 6.3 (*)    Albumin 3.1 (*)    AST 12 (*)    All other components within normal limits  BETA-HYDROXYBUTYRIC ACID - Abnormal; Notable for the following components:   Beta-Hydroxybutyric Acid 0.86 (*)    All other components within normal limits  GLUCOSE, CAPILLARY - Abnormal; Notable for the following components:   Glucose-Capillary 122 (*)    All other components within normal limits  GLUCOSE, CAPILLARY - Abnormal; Notable for the following components:   Glucose-Capillary 139 (*)    All other components within normal limits  GLUCOSE, CAPILLARY - Abnormal; Notable for the following components:   Glucose-Capillary 134 (*)    All other components within normal limits  GLUCOSE, CAPILLARY - Abnormal; Notable for the following components:   Glucose-Capillary 119 (*)    All other components within normal limits  FERRITIN - Abnormal; Notable for the following components:   Ferritin 393  (*)    All other components within normal limits  D-DIMER, QUANTITATIVE - Abnormal; Notable for the following components:   D-Dimer, Quant 0.86 (*)    All other components within normal limits  C-REACTIVE PROTEIN - Abnormal; Notable for the following components:   CRP 30.2 (*)    All other components within normal limits  CBC WITH DIFFERENTIAL/PLATELET - Abnormal; Notable for the following components:   WBC 22.7 (*)    Neutro  Abs 18.8 (*)    Monocytes Absolute 2.1 (*)    Abs Immature Granulocytes 0.23 (*)    All other components within normal limits  GLUCOSE, CAPILLARY - Abnormal; Notable for the following components:   Glucose-Capillary 112 (*)    All other components within normal limits  GLUCOSE, CAPILLARY - Abnormal; Notable for the following components:   Glucose-Capillary 121 (*)    All other components within normal limits  BASIC METABOLIC PANEL - Abnormal; Notable for the following components:   Sodium 134 (*)    Potassium 3.3 (*)    Glucose, Bld 127 (*)    Calcium 8.7 (*)    All other components within normal limits  BETA-HYDROXYBUTYRIC ACID - Abnormal; Notable for the following components:   Beta-Hydroxybutyric Acid 1.69 (*)    All other components within normal limits  GLUCOSE, CAPILLARY - Abnormal; Notable for the following components:   Glucose-Capillary 150 (*)    All other components within normal limits  BASIC METABOLIC PANEL - Abnormal; Notable for the following components:   Sodium 134 (*)    Glucose, Bld 120 (*)    Calcium 8.7 (*)    All other components within normal limits  COMPREHENSIVE METABOLIC PANEL - Abnormal; Notable for the following components:   Sodium 134 (*)    Potassium 3.2 (*)    Glucose, Bld 185 (*)    Calcium 8.3 (*)    Total Protein 6.2 (*)    Albumin 3.0 (*)    All other components within normal limits  GLUCOSE, CAPILLARY - Abnormal; Notable for the following components:   Glucose-Capillary 129 (*)    All other components within  normal limits  GLUCOSE, CAPILLARY - Abnormal; Notable for the following components:   Glucose-Capillary 135 (*)    All other components within normal limits  GLUCOSE, CAPILLARY - Abnormal; Notable for the following components:   Glucose-Capillary 126 (*)    All other components within normal limits  GLUCOSE, CAPILLARY - Abnormal; Notable for the following components:   Glucose-Capillary 134 (*)    All other components within normal limits  GLUCOSE, CAPILLARY - Abnormal; Notable for the following components:   Glucose-Capillary 118 (*)    All other components within normal limits  BETA-HYDROXYBUTYRIC ACID - Abnormal; Notable for the following components:   Beta-Hydroxybutyric Acid 1.09 (*)    All other components within normal limits  GLUCOSE, CAPILLARY - Abnormal; Notable for the following components:   Glucose-Capillary 115 (*)    All other components within normal limits  GLUCOSE, CAPILLARY - Abnormal; Notable for the following components:   Glucose-Capillary 136 (*)    All other components within normal limits  GLUCOSE, CAPILLARY - Abnormal; Notable for the following components:   Glucose-Capillary 127 (*)    All other components within normal limits  GLUCOSE, CAPILLARY - Abnormal; Notable for the following components:   Glucose-Capillary 107 (*)    All other components within normal limits  GLUCOSE, CAPILLARY - Abnormal; Notable for the following components:   Glucose-Capillary 168 (*)    All other components within normal limits  GLUCOSE, CAPILLARY - Abnormal; Notable for the following components:   Glucose-Capillary 111 (*)    All other components within normal limits  COMPREHENSIVE METABOLIC PANEL - Abnormal; Notable for the following components:   Sodium 133 (*)    CO2 21 (*)    Calcium 8.5 (*)    Albumin 2.9 (*)    All other components within normal limits  CBC WITH DIFFERENTIAL/PLATELET -  Abnormal; Notable for the following components:   WBC 12.2 (*)    Neutro Abs  8.8 (*)    Monocytes Absolute 1.3 (*)    Abs Immature Granulocytes 0.28 (*)    All other components within normal limits  GLUCOSE, CAPILLARY - Abnormal; Notable for the following components:   Glucose-Capillary 162 (*)    All other components within normal limits  I-STAT VENOUS BLOOD GAS, ED - Abnormal; Notable for the following components:   pCO2, Ven 36.7 (*)    Acid-base deficit 3.0 (*)    All other components within normal limits  CBG MONITORING, ED - Abnormal; Notable for the following components:   Glucose-Capillary 163 (*)    All other components within normal limits  CBG MONITORING, ED - Abnormal; Notable for the following components:   Glucose-Capillary 121 (*)    All other components within normal limits  CBG MONITORING, ED - Abnormal; Notable for the following components:   Glucose-Capillary 133 (*)    All other components within normal limits  TROPONIN I (HIGH SENSITIVITY) - Abnormal; Notable for the following components:   Troponin I (High Sensitivity) 91 (*)    All other components within normal limits  CULTURE, BLOOD (ROUTINE X 2)  CULTURE, BLOOD (ROUTINE X 2)  URINE CULTURE  MRSA NEXT GEN BY PCR, NASAL  LACTIC ACID, PLASMA  LACTIC ACID, PLASMA  PROCALCITONIN  MAGNESIUM  PHOSPHORUS  PROCALCITONIN  PHOSPHORUS  MAGNESIUM  PHOSPHORUS  MAGNESIUM  CBC  HEMOGLOBIN A1C  PROCALCITONIN  TROPONIN I (HIGH SENSITIVITY)  TROPONIN I (HIGH SENSITIVITY)    EKG EKG Interpretation  Date/Time:  Sunday March 08 2022 07:42:08 EST Ventricular Rate:  125 PR Interval:  134 QRS Duration: 134 QT Interval:  346 QTC Calculation: 499 R Axis:   49 Text Interpretation: Sinus tachycardia Left ventricular hypertrophy with QRS widening and repolarization abnormality ( Cornell product ) Abnormal ECG No previous ECGs available Confirmed by Alvira Monday ([id number]) on 03/10/2022 6:21:09 AM  Radiology CT Angio Chest PE W and/or Wo Contrast  Result Date: 03/08/2022 CLINICAL DATA:   Chest pain, cough, fever EXAM: CT ANGIOGRAPHY CHEST WITH CONTRAST TECHNIQUE: Multidetector CT imaging of the chest was performed using the standard protocol during bolus administration of intravenous contrast. Multiplanar CT image reconstructions and MIPs were obtained to evaluate the vascular anatomy. RADIATION DOSE REDUCTION: This exam was performed according to the departmental dose-optimization program which includes automated exposure control, adjustment of the mA and/or kV according to patient size and/or use of iterative reconstruction technique. CONTRAST:  70 mL OMNIPAQUE IOHEXOL 350 MG/ML SOLN COMPARISON:  None Available. FINDINGS: Cardiovascular: Satisfactory opacification of the pulmonary arteries to the segmental level. No evidence of pulmonary embolism. Normal heart size. No pericardial effusion. Mild atheromatous calcifications of the thoracic aorta and coronary arteries. No aneurysm or dissection. Mediastinum/Nodes: Right hilar adenopathy identified with a node measuring 2.5 cm. Right paratracheal adenopathy identified with a node measuring 1.3 cm. Additional smaller mediastinal nodes. No axillary adenopathy. There is diffuse esophageal thickening and inflammatory or neoplastic process can not be excluded on this examination. Lungs/Pleura: Dense alveolar consolidation noted in the right upper lobe. Most likely etiology is pneumonia. Follow up is necessary to ensure there is no underlying lesion present. Upper Abdomen: No acute abnormality. Musculoskeletal: No chest wall abnormality. No acute or significant osseous findings. Review of the MIP images confirms the above findings. IMPRESSION: 1. No PE, aneurysm or dissection. 2. Right hilar and mediastinal adenopathy which could be reactive, and follow up  recommended. 3. Dense alveolar consolidation, most likely pneumonia, right upper lobe. Follow up Recommended to exclude underlying lesions. 4. Nonspecific diffuse esophageal wall thickening for which  endoscopic follow-up should be considered. Electronically Signed   By: Layla Maw M.D.   On: 03/08/2022 09:43   DG Chest Port 1 View  Result Date: 03/08/2022 CLINICAL DATA:  New onset chest pain today. EXAM: PORTABLE CHEST 1 VIEW COMPARISON:  None Available. FINDINGS: The heart size and mediastinal contours are within normal limits. Aortic atherosclerotic calcification incidentally noted. Mild elevation of left hemidiaphragm is noted. Both lungs are clear. IMPRESSION: No active disease. Electronically Signed   By: Danae Orleans M.D.   On: 03/08/2022 08:18    Procedures Procedures    Medications Ordered in ED Medications  lactated ringers infusion ( Intravenous Not Given 03/08/22 1014)  dextrose 5 % in lactated ringers infusion ( Intravenous Stopped 03/09/22 0723)  dextrose 50 % solution 0-50 mL (has no administration in time range)  enoxaparin (LOVENOX) injection 40 mg (40 mg Subcutaneous Given 03/09/22 2047)  acetaminophen (TYLENOL) tablet 650 mg (has no administration in time range)    Or  acetaminophen (TYLENOL) suppository 650 mg (has no administration in time range)  ondansetron (ZOFRAN) tablet 4 mg (has no administration in time range)    Or  ondansetron (ZOFRAN) injection 4 mg (has no administration in time range)  Oral care mouth rinse (has no administration in time range)  Chlorhexidine Gluconate Cloth 2 % PADS 6 each (6 each Topical Not Given 03/09/22 1021)  nirmatrelvir/ritonavir (PAXLOVID) 3 tablet (3 tablets Oral Given 03/09/22 2046)  azithromycin (ZITHROMAX) 500 mg in sodium chloride 0.9 % 250 mL IVPB (500 mg Intravenous New Bag/Given 03/09/22 0911)  cefTRIAXone (ROCEPHIN) 1 g in sodium chloride 0.9 % 100 mL IVPB (0 g Intravenous Stopped 03/09/22 0912)  amLODipine (NORVASC) tablet 5 mg (5 mg Oral Given 03/09/22 1020)  metoprolol tartrate (LOPRESSOR) tablet 12.5 mg (12.5 mg Oral Given 03/09/22 2047)  metoprolol tartrate (LOPRESSOR) injection 5 mg (has no administration in time range)   insulin aspart (novoLOG) injection 0-15 Units ( Subcutaneous Not Given 03/09/22 1808)  insulin aspart (novoLOG) injection 0-5 Units ( Subcutaneous Not Given 03/09/22 2158)  insulin aspart (novoLOG) injection 3 Units (3 Units Subcutaneous Not Given 03/09/22 1808)  albuterol (VENTOLIN HFA) 108 (90 Base) MCG/ACT inhaler 2 puff (has no administration in time range)  cefTRIAXone (ROCEPHIN) 2 g in sodium chloride 0.9 % 100 mL IVPB (0 g Intravenous Stopped 03/08/22 0947)  azithromycin (ZITHROMAX) 500 mg in sodium chloride 0.9 % 250 mL IVPB (0 mg Intravenous Stopped 03/08/22 1020)  lactated ringers bolus 1,000 mL (0 mLs Intravenous Stopped 03/08/22 1004)  iohexol (OMNIPAQUE) 350 MG/ML injection 100 mL (70 mLs Intravenous Contrast Given 03/08/22 0913)  potassium chloride 10 mEq in 100 mL IVPB (0 mEq Intravenous Stopped 03/08/22 1247)  metoprolol tartrate (LOPRESSOR) injection 5 mg (5 mg Intravenous Given 03/08/22 1648)  magnesium sulfate IVPB 2 g 50 mL (0 g Intravenous Stopped 03/08/22 1745)  insulin detemir (LEVEMIR) injection 8 Units (8 Units Subcutaneous Given 03/08/22 2010)  potassium chloride 10 mEq in 100 mL IVPB (0 mEq Intravenous Stopped 03/09/22 0305)  potassium chloride SA (KLOR-CON M) CR tablet 40 mEq (40 mEq Oral Given 03/09/22 2047)    ED Course/ Medical Decision Making/ A&P    71yo male with history of DM, htn, hlpd who presents with concern for cough and chest pain.   Differential diagnosis for chest pain includes pulmonary embolus, dissection,  pneumothorax, pneumonia, ACS, myocarditis, pericarditis.  EKG was done and evaluate by me and discussed with cardiology and shows sinus tachycardia with wide QRS, no known prior.   Chest x-ray was done and evaluated by me and radiology and showed no sign of pneumonia or pneumothorax. Do not feel history or exam are consistent with aortic dissection.   CT PE study completed given tachycardia, sharp left sided chest pain shows no PE but does show  pneumonia which was occult on xr.  Given IV fluids, rocephin, azithromycin for pneumonia.  Also positive for COVID 19 and RSV, likely contributing to symptoms and presentation. Is out of window for paxlovid at this time.  Lactic acid WNL.  WBC 28400.    Labs completed and personally evaluated by me show mild hyperglycemia 218, with AG 23, bicarb 16, large ketones in urine consistent with DKA (with possibility also of hyperglycemia and starvation ketoacidosis) however will treat as DKA with insulin gtt.  Initial troponin 10, on recheck is 91.  Discussed with Dr. Wyline Mood as well as discussion of ECG> CP is atypical in the setting of COVID, RSV and suspected bacterial pneumonia as well as DKA. At this time will not pursue heparin gtt, low suspicion for ACS, suspect likely stress related elevation.           Final Clinical Impression(s) / ED Diagnoses Final diagnoses:  Diabetic ketoacidosis without coma associated with type 2 diabetes mellitus (HCC)  Pneumonia of right upper lobe due to infectious organism  RSV (respiratory syncytial virus infection)  COVID-19  Troponin level elevated    Rx / DC Orders ED Discharge Orders     None         Alvira Monday, MD 03/10/22 [phone]

## 2022-03-08 NOTE — ED Triage Notes (Signed)
Chest pain middle chest , sharp pain, hurts worse when cough. Coughing ,congestion for about a week, 101 fever the highest , seen by primary, dx virus, negative flu for covid at [facility]. Noted blood in his urine this am.

## 2022-03-08 NOTE — ED Notes (Signed)
CRITICAL VALUE STICKER  CRITICAL VALUE:  RECEIVER (on-site recipient of call):Keniya Schlotterbeck Oliver NOTIFIED: 03/08/2022 now  MESSENGER (representative from lab):  MD NOTIFIED: dr Billy Fischer  TIME OF NOTIFICATION:03/08/2022 now  RESPONSE:

## 2022-03-09 DIAGNOSIS — E1110 Type 2 diabetes mellitus with ketoacidosis without coma: Secondary | ICD-10-CM | POA: Diagnosis not present

## 2022-03-09 DIAGNOSIS — I493 Ventricular premature depolarization: Secondary | ICD-10-CM

## 2022-03-09 DIAGNOSIS — J121 Respiratory syncytial virus pneumonia: Secondary | ICD-10-CM | POA: Diagnosis not present

## 2022-03-09 DIAGNOSIS — U071 COVID-19: Secondary | ICD-10-CM

## 2022-03-09 LAB — URINE CULTURE: Culture: NO GROWTH

## 2022-03-09 LAB — CBC WITH DIFFERENTIAL/PLATELET
Abs Immature Granulocytes: 0.23 10*3/uL — ABNORMAL HIGH (ref 0.00–0.07)
Basophils Absolute: 0.1 10*3/uL (ref 0.0–0.1)
Basophils Relative: 0 %
Eosinophils Absolute: 0.1 10*3/uL (ref 0.0–0.5)
Eosinophils Relative: 0 %
HCT: 42.6 % (ref 39.0–52.0)
Hemoglobin: 13.7 g/dL (ref 13.0–17.0)
Immature Granulocytes: 1 %
Lymphocytes Relative: 7 %
Lymphs Abs: 1.5 10*3/uL (ref 0.7–4.0)
MCH: 28.3 pg (ref 26.0–34.0)
MCHC: 32.2 g/dL (ref 30.0–36.0)
MCV: 88 fL (ref 80.0–100.0)
Monocytes Absolute: 2.1 10*3/uL — ABNORMAL HIGH (ref 0.1–1.0)
Monocytes Relative: 9 %
Neutro Abs: 18.8 10*3/uL — ABNORMAL HIGH (ref 1.7–7.7)
Neutrophils Relative %: 83 %
Platelets: 246 10*3/uL (ref 150–400)
RBC: 4.84 MIL/uL (ref 4.22–5.81)
RDW: 12.9 % (ref 11.5–15.5)
WBC: 22.7 10*3/uL — ABNORMAL HIGH (ref 4.0–10.5)
nRBC: 0 % (ref 0.0–0.2)

## 2022-03-09 LAB — GLUCOSE, CAPILLARY
Glucose-Capillary: 107 mg/dL — ABNORMAL HIGH (ref 70–99)
Glucose-Capillary: 111 mg/dL — ABNORMAL HIGH (ref 70–99)
Glucose-Capillary: 115 mg/dL — ABNORMAL HIGH (ref 70–99)
Glucose-Capillary: 118 mg/dL — ABNORMAL HIGH (ref 70–99)
Glucose-Capillary: 126 mg/dL — ABNORMAL HIGH (ref 70–99)
Glucose-Capillary: 127 mg/dL — ABNORMAL HIGH (ref 70–99)
Glucose-Capillary: 134 mg/dL — ABNORMAL HIGH (ref 70–99)
Glucose-Capillary: 135 mg/dL — ABNORMAL HIGH (ref 70–99)
Glucose-Capillary: 136 mg/dL — ABNORMAL HIGH (ref 70–99)
Glucose-Capillary: 162 mg/dL — ABNORMAL HIGH (ref 70–99)
Glucose-Capillary: 168 mg/dL — ABNORMAL HIGH (ref 70–99)

## 2022-03-09 LAB — D-DIMER, QUANTITATIVE: D-Dimer, Quant: 0.86 ug/mL-FEU — ABNORMAL HIGH (ref 0.00–0.50)

## 2022-03-09 LAB — BASIC METABOLIC PANEL
Anion gap: 11 (ref 5–15)
BUN: 12 mg/dL (ref 8–23)
CO2: 23 mmol/L (ref 22–32)
Calcium: 8.7 mg/dL — ABNORMAL LOW (ref 8.9–10.3)
Chloride: 100 mmol/L (ref 98–111)
Creatinine, Ser: 0.83 mg/dL (ref 0.61–1.24)
GFR, Estimated: >60 mL/min (ref >60)
Glucose, Bld: 120 mg/dL — ABNORMAL HIGH (ref 70–99)
Potassium: 3.5 mmol/L (ref 3.5–5.1)
Sodium: 134 mmol/L — ABNORMAL LOW (ref 135–145)

## 2022-03-09 LAB — COMPREHENSIVE METABOLIC PANEL
ALT: 17 U/L (ref 0–44)
AST: 18 U/L (ref 15–41)
Albumin: 3 g/dL — ABNORMAL LOW (ref 3.5–5.0)
Alkaline Phosphatase: 80 U/L (ref 38–126)
Anion gap: 10 (ref 5–15)
BUN: 13 mg/dL (ref 8–23)
CO2: 23 mmol/L (ref 22–32)
Calcium: 8.3 mg/dL — ABNORMAL LOW (ref 8.9–10.3)
Chloride: 101 mmol/L (ref 98–111)
Creatinine, Ser: 0.69 mg/dL (ref 0.61–1.24)
GFR, Estimated: >60 mL/min (ref >60)
Glucose, Bld: 185 mg/dL — ABNORMAL HIGH (ref 70–99)
Potassium: 3.2 mmol/L — ABNORMAL LOW (ref 3.5–5.1)
Sodium: 134 mmol/L — ABNORMAL LOW (ref 135–145)
Total Bilirubin: 0.8 mg/dL (ref 0.3–1.2)
Total Protein: 6.2 g/dL — ABNORMAL LOW (ref 6.5–8.1)

## 2022-03-09 LAB — PHOSPHORUS: Phosphorus: 2.6 mg/dL (ref 2.5–4.6)

## 2022-03-09 LAB — C-REACTIVE PROTEIN: CRP: 30.2 mg/dL — ABNORMAL HIGH (ref <1.0)

## 2022-03-09 LAB — MAGNESIUM: Magnesium: 2.2 mg/dL (ref 1.7–2.4)

## 2022-03-09 LAB — FERRITIN: Ferritin: 393 ng/mL — ABNORMAL HIGH (ref 24–336)

## 2022-03-09 LAB — PROCALCITONIN: Procalcitonin: 0.56 ng/mL

## 2022-03-09 LAB — BETA-HYDROXYBUTYRIC ACID: Beta-Hydroxybutyric Acid: 1.09 mmol/L — ABNORMAL HIGH (ref 0.05–0.27)

## 2022-03-09 MED ORDER — ALBUTEROL SULFATE HFA 108 (90 BASE) MCG/ACT IN AERS: 2.0000 | INHALATION_SPRAY | RESPIRATORY_TRACT | Status: DC | PRN

## 2022-03-09 MED ORDER — INSULIN ASPART 100 UNIT/ML IJ SOLN
0.0000 [IU] | Freq: Three times a day (TID) | INTRAMUSCULAR | Status: DC
  Administered 2022-03-09: 3 [IU] via SUBCUTANEOUS

## 2022-03-09 MED ORDER — POTASSIUM CHLORIDE CRYS ER 20 MEQ PO TBCR
40.0000 meq | EXTENDED_RELEASE_TABLET | Freq: Two times a day (BID) | ORAL | Status: AC
  Administered 2022-03-09 (×2): 40 meq via ORAL
  Filled 2022-03-09 (×2): qty 2

## 2022-03-09 MED ORDER — ROSUVASTATIN CALCIUM 10 MG PO TABS: 10.0000 mg | ORAL_TABLET | Freq: Every day | ORAL | Status: DC

## 2022-03-09 MED ORDER — METOPROLOL TARTRATE 5 MG/5ML IV SOLN: 5.0000 mg | Freq: Four times a day (QID) | INTRAVENOUS | Status: DC | PRN

## 2022-03-09 MED ORDER — INSULIN ASPART 100 UNIT/ML IJ SOLN: 3.0000 [IU] | Freq: Three times a day (TID) | INTRAMUSCULAR | Status: DC

## 2022-03-09 MED ORDER — INSULIN ASPART 100 UNIT/ML IJ SOLN: 0.0000 [IU] | Freq: Every day | INTRAMUSCULAR | Status: DC

## 2022-03-09 MED ORDER — AMLODIPINE BESYLATE 5 MG PO TABS
5.0000 mg | ORAL_TABLET | Freq: Every day | ORAL | Status: DC
  Administered 2022-03-09 – 2022-03-10 (×2): 5 mg via ORAL
  Filled 2022-03-09 (×2): qty 1

## 2022-03-09 MED ORDER — ROSUVASTATIN CALCIUM 10 MG PO TABS
10.0000 mg | ORAL_TABLET | Freq: Every day | ORAL | Status: DC
  Administered 2022-03-09: 10 mg via ORAL
  Filled 2022-03-09: qty 1

## 2022-03-09 MED ORDER — METOPROLOL TARTRATE 12.5 MG HALF TABLET
12.5000 mg | ORAL_TABLET | Freq: Two times a day (BID) | ORAL | Status: DC
  Administered 2022-03-09 (×2): 12.5 mg via ORAL
  Filled 2022-03-09 (×2): qty 1

## 2022-03-09 MED ORDER — POTASSIUM CHLORIDE 10 MEQ/100ML IV SOLN
10.0000 meq | INTRAVENOUS | Status: AC
  Administered 2022-03-09 (×2): 10 meq via INTRAVENOUS
  Filled 2022-03-09 (×2): qty 100

## 2022-03-09 NOTE — Evaluation (Signed)
Physical Therapy Evaluation Patient Details Name: Rodney Mcbride MRN: [id number] DOB: 15-Oct-1950 Today's Date: 03/09/2022  History of Present Illness  72 y.o. male past medical history significant for diabetes mellitus type 2, essential hypertension comes in for mid chest pain productive cough fever and nasal congestion was recently diagnosed by his PCP with RSV influenza and COVID negative in the ED was found to be COVID-positive,  Clinical Impression  Pt is independent with mobility, he ambulated 80' holding IV pole without loss of balance, SpO2 96% on room air walking. 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 Pt does not need further PT. He was encouraged to walk several times/day in his room and to perform seated marching and knee extension to minimize deconditioning during hospitalization. PT signing off.          Recommendations for follow up therapy are one component of a multi-disciplinary discharge planning process, led by the attending physician.  Recommendations may be updated based on patient status, additional functional criteria and insurance authorization.  Follow Up Recommendations No PT follow up      Assistance Recommended at Discharge None  Patient can return home with the following       Equipment Recommendations None recommended by PT  Recommendations for Other Services       Functional Status Assessment Patient has not had a recent decline in their functional status     Precautions / Restrictions Precautions Precautions: None Restrictions Weight Bearing Restrictions: No      Mobility  Bed Mobility Overal bed mobility: Modified Independent             General bed mobility comments: HOB up    Transfers Overall transfer level: Independent Equipment used: None                    Ambulation/Gait Ambulation/Gait assistance: Modified independent (Device/Increase time) Gait Distance (Feet): 80 Feet Assistive  device: IV Pole Gait Pattern/deviations: WFL(Within Functional Limits) Gait velocity: WFL     General Gait Details: steady walking multiple laps around foot of bed in room, no loss of balance, no dyspnea, SpO2 96% on room air walking  Stairs            Wheelchair Mobility    Modified Rankin (Stroke Patients Only)       Balance Overall balance assessment: Modified Independent                                           Pertinent Vitals/Pain Pain Assessment Pain Assessment: No/denies pain    Home Living Family/patient expects to be discharged to:: Private residence   Available Help at Discharge: Family;Available PRN/intermittently                    Prior Function Prior Level of Function : Independent/Modified Independent;Driving             Mobility Comments: walks without AD, no falls in past 6 months, works as a Development worker, community, lives with wife who works part time ADLs Comments: independent     Journalist, newspaper        Extremity/Trunk Assessment   Upper Extremity Assessment Upper Extremity Assessment: Overall WFL for tasks assessed    Lower Extremity Assessment Lower Extremity Assessment: Overall WFL for tasks assessed    Cervical / Trunk Assessment Cervical / Trunk Assessment: Normal  Communication  Communication: No difficulties  Cognition Arousal/Alertness: Awake/alert Behavior During Therapy: WFL for tasks assessed/performed Overall Cognitive Status: Within Functional Limits for tasks assessed                                          General Comments      Exercises     Assessment/Plan    PT Assessment Patient does not need any further PT services  PT Problem List         PT Treatment Interventions      PT Goals (Current goals can be found in the Care Plan section)  Acute Rehab PT Goals Patient Stated Goal: 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, community PT Goal Formulation: All assessment and education complete,  DC therapy    Frequency       Co-evaluation               AM-PAC PT "6 Clicks" Mobility  Outcome Measure Help needed turning from your back to your side while in a flat bed without using bedrails?: None Help needed moving from lying on your back to sitting on the side of a flat bed without using bedrails?: None Help needed moving to and from a bed to a chair (including a wheelchair)?: None Help needed standing up from a chair using your arms (e.g., wheelchair or bedside chair)?: None Help needed to walk in hospital room?: None Help needed climbing 3-5 steps with a railing? : None 6 Click Score: 24    End of Session   Activity Tolerance: Patient tolerated treatment well Patient left: in bed;with call bell/phone within reach        Time: 1313-1330 PT Time Calculation (min) (ACUTE ONLY): 17 min   Charges:   PT Evaluation $PT Eval Low Complexity: 1 Low          Philomena Doheny PT 03/09/2022  Acute Rehabilitation Services  Office [phone]

## 2022-03-09 NOTE — Progress Notes (Signed)
[facility] PROGRESS NOTE    Progress Note  Rodney Mcbride  TKZ:[id number] DOB: 10/15/1950 DOA: 03/08/2022 PCP: Vernie Shanks, MD (Inactive)     Brief Narrative:   Rodney Mcbride is an 72 y.o. male past medical history significant for diabetes mellitus type 2, essential hypertension comes in for mid chest pain productive cough fever and nasal congestion was recently diagnosed by his PCP with RSV influenza and COVID negative in the ED was found to be COVID-positive, pulse of 125 started on Rocephin and azithromycin blood glucose 218, positive ketones hemoglobin of 15 and an anion gap    Assessment/Plan:   DKA, type 2 ([place]) Started on IV fluids IV insulin electrolytes were monitored and repleted as needed. Now has been transitioned to long-acting insulin plus sliding scale. Is likely due to infectious etiology. Now off insulin drip continue sliding scale insulin. He does not use insulin at home he probably will not need insulin as an outpatient  Viral pneumonia RSV and probably COVID: Has been weaned off oxygen continue inhalers as needed he was started on IV Rocephin and azithromycin, as he has mild leukocytosis procalcitonin is low-year-old will have a low threshold to discontinue antibiotics. Continue Paxlovid. Culture data has been sent. Leukocytosis improving.  Essential hypertension: Holding antihypertensive medications his blood pressure was borderline on admission. Blood pressure significantly improved this morning will start Norvasc. Was started on IV fluid hydration.  Hyperlipidemia: Continue statins.  Hyperbilirubinemia:  likely due to poor oral intake and dehydration  Hypocalcemia: Corrected for the albumin was unremarkable.  Frequent PVCs: Continue to monitor denies any chest pain or shortness of breath. Started on beta-blocker schedule Continue metoprolol IV as needed was given 2 g of mag. Try to keep potassium greater than 4 magnesium greater than  2. Magnesium was 2.2, replete potassium orally 3.5 this morning.   2D echo is pending.  DVT prophylaxis: lovenox Family Communication:none Status is: Inpatient Remains inpatient appropriate because: DKA SARS-CoV-2 and RSV infection can be transferred to [facility]    Code Status:     Code Status Orders  (From admission, onward)           Start     Ordered   03/08/22 1427  Full code  Continuous       Question:  By:  Answer:  Consent: discussion documented in EHR   03/08/22 1427           Code Status History     This patient has a current code status but no historical code status.         IV Access:   Peripheral IV   Procedures and diagnostic studies:   CT Angio Chest PE W and/or Wo Contrast  Result Date: 03/08/2022 CLINICAL DATA:  Chest pain, cough, fever EXAM: CT ANGIOGRAPHY CHEST WITH CONTRAST TECHNIQUE: Multidetector CT imaging of the chest was performed using the standard protocol during bolus administration of intravenous contrast. Multiplanar CT image reconstructions and MIPs were obtained to evaluate the vascular anatomy. RADIATION DOSE REDUCTION: This exam was performed according to the departmental dose-optimization program which includes automated exposure control, adjustment of the mA and/or kV according to patient size and/or use of iterative reconstruction technique. CONTRAST:  70 mL OMNIPAQUE IOHEXOL 350 MG/ML SOLN COMPARISON:  None Available. FINDINGS: Cardiovascular: Satisfactory opacification of the pulmonary arteries to the segmental level. No evidence of pulmonary embolism. Normal heart size. No pericardial effusion. Mild atheromatous calcifications of the thoracic aorta and coronary arteries. No aneurysm or dissection. Mediastinum/Nodes: Right  hilar adenopathy identified with a node measuring 2.5 cm. Right paratracheal adenopathy identified with a node measuring 1.3 cm. Additional smaller mediastinal nodes. No axillary adenopathy. There is diffuse  esophageal thickening and inflammatory or neoplastic process can not be excluded on this examination. Lungs/Pleura: Dense alveolar consolidation noted in the right upper lobe. Most likely etiology is pneumonia. Follow up is necessary to ensure there is no underlying lesion present. Upper Abdomen: No acute abnormality. Musculoskeletal: No chest wall abnormality. No acute or significant osseous findings. Review of the MIP images confirms the above findings. IMPRESSION: 1. No PE, aneurysm or dissection. 2. Right hilar and mediastinal adenopathy which could be reactive, and follow up recommended. 3. Dense alveolar consolidation, most likely pneumonia, right upper lobe. Follow up Recommended to exclude underlying lesions. 4. Nonspecific diffuse esophageal wall thickening for which endoscopic follow-up should be considered. Electronically Signed   By: Sammie Bench M.D.   On: 03/08/2022 09:43   DG Chest Port 1 View  Result Date: 03/08/2022 CLINICAL DATA:  New onset chest pain today. EXAM: PORTABLE CHEST 1 VIEW COMPARISON:  None Available. FINDINGS: The heart size and mediastinal contours are within normal limits. Aortic atherosclerotic calcification incidentally noted. Mild elevation of left hemidiaphragm is noted. Both lungs are clear. IMPRESSION: No active disease. Electronically Signed   By: Marlaine Hind M.D.   On: 03/08/2022 08:18     Medical Consultants:   None.   Subjective:    Rodney Mcbride relates he feels better than yesterday.  Objective:    Vitals:   03/09/22 0300 03/09/22 0400 03/09/22 0500 03/09/22 0600  BP: (!) 156/59 (!) 164/84 (!) 163/66 (!) 152/81  Pulse: (!) 45 (!) 45 (!) 46 (!) 46  Resp: (!) 21 20 (!) 22 18  Temp:   99 F (37.2 C)   TempSrc:   Oral   SpO2: 93% 98% 93% 92%  Weight:      Height:       SpO2: 92 %   Intake/Output Summary (Last 24 hours) at 03/09/2022 0701 Last data filed at 03/08/2022 2200 Gross per 24 hour  Intake 2539.38 ml  Output 400 ml  Net  2139.38 ml   Filed Weights   03/08/22 1005 03/08/22 1400  Weight: 86.2 kg 84.8 kg    Exam: General exam: In no acute distress. Respiratory system: Good air movement and clear to auscultation. Cardiovascular system: S1 & S2 heard, RRR. No JVD. Gastrointestinal system: Abdomen is nondistended, soft and nontender.  Extremities: No pedal edema. Skin: No rashes, lesions or ulcers Psychiatry: Judgement and insight appear normal. Mood & affect appropriate.    Data Reviewed:    Labs: Basic Metabolic Panel: Recent Labs  Lab 03/08/22 0749 03/08/22 1014 03/08/22 1142 03/08/22 1614 03/08/22 1636 03/08/22 2144 03/09/22 0247 03/09/22 0257  NA 135 135 136 138  --  134*  --  134*  K 3.9 3.9 3.8 3.7  --  3.3*  --  3.5  CL 96*  --  101 105  --  101  --  100  CO2 16*  --  24 24  --  23  --  23  GLUCOSE 218*  --  143* 115*  --  127*  --  120*  BUN 17  --  14 14  --  12  --  12  CREATININE 1.06  --  0.90 0.91  --  0.84  --  0.83  CALCIUM 9.9  --  8.8* 8.8*  --  8.7*  --  8.7*  MG  --   --   --   --  2.0  --  2.2  --   PHOS  --   --   --   --  2.7  --  2.6  --    GFR Estimated Creatinine Clearance: 83.7 mL/min (by C-G formula based on SCr of 0.83 mg/dL). Liver Function Tests: Recent Labs  Lab 03/08/22 0749 03/08/22 1614  AST 11* 12*  ALT 12 15  ALKPHOS 84 67  BILITOT 1.3* 1.0  PROT 7.9 6.3*  ALBUMIN 4.2 3.1*   No results for input(s): "LIPASE", "AMYLASE" in the last 168 hours. No results for input(s): "AMMONIA" in the last 168 hours. Coagulation profile No results for input(s): "INR", "PROTIME" in the last 168 hours. COVID-19 Labs  Recent Labs    03/09/22 0247  DDIMER 0.86*  FERRITIN 393*    Lab Results  Component Value Date   SARSCOV2NAA POSITIVE (A) 03/08/2022    CBC: Recent Labs  Lab 03/08/22 0749 03/08/22 1014 03/09/22 0247  WBC 28.4*  --  22.7*  NEUTROABS 24.2*  --  18.8*  HGB 14.9 13.6 13.7  HCT 46.9 40.0 42.6  MCV 88.5  --  88.0  PLT 295  --  246    Cardiac Enzymes: No results for input(s): "CKTOTAL", "CKMB", "CKMBINDEX", "TROPONINI" in the last 168 hours. BNP (last 3 results) No results for input(s): "PROBNP" in the last 8760 hours. CBG: Recent Labs  Lab 03/09/22 0250 03/09/22 0356 03/09/22 0457 03/09/22 0554 03/09/22 0656  GLUCAP 134* 118* 115* 136* 127*   D-Dimer: Recent Labs    03/09/22 0247  DDIMER 0.86*   Hgb A1c: No results for input(s): "HGBA1C" in the last 72 hours. Lipid Profile: No results for input(s): "CHOL", "HDL", "LDLCALC", "TRIG", "CHOLHDL", "LDLDIRECT" in the last 72 hours. Thyroid function studies: No results for input(s): "TSH", "T4TOTAL", "T3FREE", "THYROIDAB" in the last 72 hours.  Invalid input(s): "FREET3" Anemia work up: Recent Labs    03/09/22 0247  FERRITIN 393*   Sepsis Labs: Recent Labs  Lab 03/08/22 0749 03/08/22 0843 03/08/22 0946 03/08/22 1020 03/09/22 0247  PROCALCITON  --   --   --  1.00 0.56  WBC 28.4*  --   --   --  22.7*  LATICACIDVEN  --  1.4 1.9  --   --    Microbiology Recent Results (from the past 240 hour(s))  Resp panel by RT-PCR (RSV, Flu A&B, Covid) Anterior Nasal Swab     Status: Abnormal   Collection Time: 03/08/22  7:50 AM   Specimen: Anterior Nasal Swab  Result Value Ref Range Status   SARS Coronavirus 2 by RT PCR POSITIVE (A) NEGATIVE Final    Comment: (NOTE) SARS-CoV-2 target nucleic acids are DETECTED.  The SARS-CoV-2 RNA is generally detectable in upper respiratory specimens during the acute phase of infection. Positive results are indicative of the presence of the identified virus, but do not rule out bacterial infection or co-infection with other pathogens not detected by the test. Clinical correlation with patient history and other diagnostic information is necessary to determine patient infection status. The expected result is Negative.  Fact Sheet for Patients: EntrepreneurPulse.com.au  Fact Sheet for Healthcare  Providers: IncredibleEmployment.be  This test is not yet approved or cleared by the [place] FDA and  has been authorized for detection and/or diagnosis of SARS-CoV-2 by FDA under an Emergency Use Authorization (EUA).  This EUA will remain in effect (meaning this test can be  used) for the duration of  the COVID-19 declaration under Section 564(b)(1) of the A ct, 21 U.S.C. section 360bbb-3(b)(1), unless the authorization is terminated or revoked sooner.     Influenza A by PCR NEGATIVE NEGATIVE Final   Influenza B by PCR NEGATIVE NEGATIVE Final    Comment: (NOTE) The Xpert Xpress SARS-CoV-2/FLU/RSV plus assay is intended as an aid in the diagnosis of influenza from Nasopharyngeal swab specimens and should not be used as a sole basis for treatment. Nasal washings and aspirates are unacceptable for Xpert Xpress SARS-CoV-2/FLU/RSV testing.  Fact Sheet for Patients: EntrepreneurPulse.com.au  Fact Sheet for Healthcare Providers: IncredibleEmployment.be  This test is not yet approved or cleared by the [place] FDA and has been authorized for detection and/or diagnosis of SARS-CoV-2 by FDA under an Emergency Use Authorization (EUA). This EUA will remain in effect (meaning this test can be used) for the duration of the COVID-19 declaration under Section 564(b)(1) of the Act, 21 U.S.C. section 360bbb-3(b)(1), unless the authorization is terminated or revoked.     Resp Syncytial Virus by PCR POSITIVE (A) NEGATIVE Final    Comment: (NOTE) Fact Sheet for Patients: EntrepreneurPulse.com.au  Fact Sheet for Healthcare Providers: IncredibleEmployment.be  This test is not yet approved or cleared by the [place] FDA and has been authorized for detection and/or diagnosis of SARS-CoV-2 by FDA under an Emergency Use Authorization (EUA). This EUA will remain in effect (meaning this test can  be used) for the duration of the COVID-19 declaration under Section 564(b)(1) of the Act, 21 U.S.C. section 360bbb-3(b)(1), unless the authorization is terminated or revoked.  Performed at [facility], [address] [place], [place] [address]   MRSA Next Gen by PCR, Nasal     Status: None   Collection Time: 03/08/22  2:31 PM   Specimen: Nasal Mucosa; Nasal Swab  Result Value Ref Range Status   MRSA by PCR Next Gen NOT DETECTED NOT DETECTED Final    Comment: (NOTE) The GeneXpert MRSA Assay (FDA approved for NASAL specimens only), is one component of a comprehensive MRSA colonization surveillance program. It is not intended to diagnose MRSA infection nor to guide or monitor treatment for MRSA infections. Test performance is not FDA approved in patients less than 45 years old. Performed at [facility], [place] [address] [place], [place] [address]      Medications:    Chlorhexidine Gluconate Cloth  6 each Topical Daily   enoxaparin (LOVENOX) injection  40 mg Subcutaneous Q24H   nirmatrelvir/ritonavir  3 tablet Oral BID   Continuous Infusions:  azithromycin     cefTRIAXone (ROCEPHIN)  IV     dextrose 5% lactated ringers 125 mL/hr at 03/09/22 0214   insulin 1.3 Units/hr (03/08/22 1800)   lactated ringers        LOS: 1 day   Charlynne Cousins  [facility]  03/09/2022, 7:01 AM

## 2022-03-09 NOTE — TOC CM/SW Note (Signed)
Transition of Care [facility]) Screening Note  Patient Details  Name: Rodney Mcbride Date of Birth: February 10, 1951  Transition of Care [facility]) CM/SW Contact:    Sherie Don, LCSW Phone Number: 03/09/2022, 2:19 PM  Transition of Care Department [facility]) has reviewed patient and no TOC needs have been identified at this time. We will continue to monitor patient advancement through interdisciplinary progression rounds. If new patient transition needs arise, please place a TOC consult.

## 2022-03-10 DIAGNOSIS — B338 Other specified viral diseases: Secondary | ICD-10-CM | POA: Diagnosis not present

## 2022-03-10 DIAGNOSIS — J189 Pneumonia, unspecified organism: Secondary | ICD-10-CM

## 2022-03-10 DIAGNOSIS — U071 COVID-19: Secondary | ICD-10-CM | POA: Diagnosis not present

## 2022-03-10 DIAGNOSIS — E1110 Type 2 diabetes mellitus with ketoacidosis without coma: Secondary | ICD-10-CM | POA: Diagnosis not present

## 2022-03-10 DIAGNOSIS — R7989 Other specified abnormal findings of blood chemistry: Secondary | ICD-10-CM

## 2022-03-10 LAB — CBC WITH DIFFERENTIAL/PLATELET
Abs Immature Granulocytes: 0.28 10*3/uL — ABNORMAL HIGH (ref 0.00–0.07)
Basophils Absolute: 0.1 10*3/uL (ref 0.0–0.1)
Basophils Relative: 1 %
Eosinophils Absolute: 0.3 10*3/uL (ref 0.0–0.5)
Eosinophils Relative: 3 %
HCT: 44.2 % (ref 39.0–52.0)
Hemoglobin: 13.7 g/dL (ref 13.0–17.0)
Immature Granulocytes: 2 %
Lymphocytes Relative: 12 %
Lymphs Abs: 1.4 10*3/uL (ref 0.7–4.0)
MCH: 28.2 pg (ref 26.0–34.0)
MCHC: 31 g/dL (ref 30.0–36.0)
MCV: 90.9 fL (ref 80.0–100.0)
Monocytes Absolute: 1.3 10*3/uL — ABNORMAL HIGH (ref 0.1–1.0)
Monocytes Relative: 11 %
Neutro Abs: 8.8 10*3/uL — ABNORMAL HIGH (ref 1.7–7.7)
Neutrophils Relative %: 71 %
Platelets: 244 10*3/uL (ref 150–400)
RBC: 4.86 MIL/uL (ref 4.22–5.81)
RDW: 13 % (ref 11.5–15.5)
WBC: 12.2 10*3/uL — ABNORMAL HIGH (ref 4.0–10.5)
nRBC: 0 % (ref 0.0–0.2)

## 2022-03-10 LAB — COMPREHENSIVE METABOLIC PANEL
ALT: 20 U/L (ref 0–44)
AST: 18 U/L (ref 15–41)
Albumin: 2.9 g/dL — ABNORMAL LOW (ref 3.5–5.0)
Alkaline Phosphatase: 75 U/L (ref 38–126)
Anion gap: 11 (ref 5–15)
BUN: 12 mg/dL (ref 8–23)
CO2: 21 mmol/L — ABNORMAL LOW (ref 22–32)
Calcium: 8.5 mg/dL — ABNORMAL LOW (ref 8.9–10.3)
Chloride: 101 mmol/L (ref 98–111)
Creatinine, Ser: 0.77 mg/dL (ref 0.61–1.24)
GFR, Estimated: >60 mL/min (ref >60)
Glucose, Bld: 98 mg/dL (ref 70–99)
Potassium: 3.7 mmol/L (ref 3.5–5.1)
Sodium: 133 mmol/L — ABNORMAL LOW (ref 135–145)
Total Bilirubin: 0.8 mg/dL (ref 0.3–1.2)
Total Protein: 6.6 g/dL (ref 6.5–8.1)

## 2022-03-10 LAB — MAGNESIUM: Magnesium: 2.1 mg/dL (ref 1.7–2.4)

## 2022-03-10 LAB — GLUCOSE, CAPILLARY: Glucose-Capillary: 94 mg/dL (ref 70–99)

## 2022-03-10 LAB — PROCALCITONIN: Procalcitonin: 0.26 ng/mL

## 2022-03-10 LAB — HEMOGLOBIN A1C
Hgb A1c MFr Bld: 7.6 % — ABNORMAL HIGH (ref 4.8–5.6)
Mean Plasma Glucose: 171 mg/dL

## 2022-03-10 LAB — PHOSPHORUS: Phosphorus: 3.7 mg/dL (ref 2.5–4.6)

## 2022-03-10 MED ORDER — NIRMATRELVIR/RITONAVIR (PAXLOVID)TABLET: 3.0000 | ORAL_TABLET | Freq: Two times a day (BID) | ORAL | 0 refills | Status: AC

## 2022-03-10 MED ORDER — AZITHROMYCIN 500 MG PO TABS: 500.0000 mg | ORAL_TABLET | Freq: Every day | ORAL | 0 refills | Status: AC

## 2022-03-10 MED ORDER — CEFDINIR 300 MG PO CAPS: 300.0000 mg | ORAL_CAPSULE | Freq: Two times a day (BID) | ORAL | 0 refills | Status: AC

## 2022-03-10 NOTE — Inpatient Diabetes Management (Signed)
Inpatient Diabetes Program Recommendations  AACE/ADA: New Consensus Statement on Inpatient Glycemic Control (2015)  Target Ranges:  Prepandial:   less than 140 mg/dL      Peak postprandial:   less than 180 mg/dL (1-2 hours)      Critically ill patients:  140 - 180 mg/dL   Lab Results  Component Value Date   GLUCAP 94 03/10/2022   HGBA1C 7.6 (H) 03/09/2022    Review of Glycemic Control  Diabetes history: DM2 Outpatient Diabetes medications: Jardiance 25 mg QD, Metformin 500 mg BID Current orders for Inpatient glycemic control: Novolog 0-15 units TID and 0-5 units QHS, Novolog 3 units TID  Spoke with patient briefly at bedside.   He is discharging today.   Admitted with DKA.  Initial glucose was 218 mg/dL with acidosis.  He states he has taken Jardiance for several years and has never been admitted with DKA before.  COVID (+).  Educated him on ensuring good hydration while taking Jardiance.  If he continues to have symptoms of dehydration, he may need to talk to his PCP about discontinuing this medication for risk of euglycemic DKA with SGLT-2i's.  Will continue to follow while inpatient.  Thank you, Reche Dixon, MSN, [place] Diabetes Coordinator Inpatient Diabetes Program [phone] (team pager from 8a-5p)

## 2022-03-10 NOTE — Discharge Summary (Addendum)
Physician Discharge Summary  Rodney Mcbride GYF:[id number] DOB: [birth year] DOA: 03/08/2022  PCP: Vernie Shanks, MD (Inactive)  Admit date: 03/08/2022 Discharge date: 03/10/2022  Admitted From: Home Disposition:  Home  Recommendations for Outpatient Follow-up:  Follow up with PCP in 1-2 weeks Please obtain BMP/CBC in one week   Home Health:No Equipment/Devices:None  Discharge Condition:Stable CODE STATUS:Full Diet recommendation: Heart Healthy  Brief/Interim Summary: 72 y.o. male past medical history significant for diabetes mellitus type 2, essential hypertension comes in for mid chest pain productive cough fever and nasal congestion was recently diagnosed by his PCP with RSV influenza and COVID negative in the ED was found to be COVID-positive, pulse of 125 started on Rocephin and azithromycin blood glucose 218, positive ketones hemoglobin of 15 and an anion gap   Discharge Diagnoses:  Principal Problem:   DKA, type 2 ([place]) Active Problems:   CAP (community acquired pneumonia)   RSV (respiratory syncytial virus pneumonia)   COVID-19 virus infection   Hypertension   Hyperlipidemia   Hyperbilirubinemia   Hypocalcemia   Frequent PVCs  DKA likely due to infectious etiology/diabetes mellitus type 2 without insulin use Will start on IV insulin is anion gap closed you transition to long-acting subcu sliding scale his blood glucose remain well-controlled. He was switched back to his home regimen as an outpatient.  Viral pneumonia RSV and probably COVID: He was started on Paxlovid and also empirically on antibiotics, he was weaned to room air he will finish treatment as an outpatient.  Essential hypertension: Resume antihypertensive medication as an outpatient.  Hypokalemia: Repleted orally now resolved.  Hyperlipidemia: Continue statins.  Hyperbilirubinemia: Likely due to poor intake now resolved.  Hypocalcemia: Corrected for albumin was unremarkable.  Frequent  PVCs: This resolved.   Discharge Instructions  Discharge Instructions     Diet - low sodium heart healthy   Complete by: As directed    Increase activity slowly   Complete by: As directed       Allergies as of 03/10/2022   No Known Allergies      Medication List     TAKE these medications    amLODipine 5 MG tablet Commonly known as: NORVASC Take 5 mg by mouth daily.   azithromycin 500 MG tablet Commonly known as: Zithromax Take 1 tablet (500 mg total) by mouth daily for 4 days. Take 1 tablet daily for 3 days.   cefdinir 300 MG capsule Commonly known as: OMNICEF Take 1 capsule (300 mg total) by mouth 2 (two) times daily for 4 days.   Jardiance 25 MG Tabs tablet Generic drug: empagliflozin Take 25 mg by mouth daily.   metFORMIN 500 MG tablet Commonly known as: GLUCOPHAGE Take 1,000 mg by mouth 2 (two) times daily with a meal.   nirmatrelvir/ritonavir 20 x 150 MG & 10 x '100MG'$  Tabs Commonly known as: PAXLOVID Take 3 tablets by mouth 2 (two) times daily for 1 day.   rosuvastatin 10 MG tablet Commonly known as: CRESTOR Take 10 mg by mouth daily.        No Known Allergies  Consultations: None   Procedures/Studies: CT Angio Chest PE W and/or Wo Contrast  Result Date: 03/08/2022 CLINICAL DATA:  Chest pain, cough, fever EXAM: CT ANGIOGRAPHY CHEST WITH CONTRAST TECHNIQUE: Multidetector CT imaging of the chest was performed using the standard protocol during bolus administration of intravenous contrast. Multiplanar CT image reconstructions and MIPs were obtained to evaluate the vascular anatomy. RADIATION DOSE REDUCTION: This exam was performed according to the departmental  dose-optimization program which includes automated exposure control, adjustment of the mA and/or kV according to patient size and/or use of iterative reconstruction technique. CONTRAST:  70 mL OMNIPAQUE IOHEXOL 350 MG/ML SOLN COMPARISON:  None Available. FINDINGS: Cardiovascular: Satisfactory  opacification of the pulmonary arteries to the segmental level. No evidence of pulmonary embolism. Normal heart size. No pericardial effusion. Mild atheromatous calcifications of the thoracic aorta and coronary arteries. No aneurysm or dissection. Mediastinum/Nodes: Right hilar adenopathy identified with a node measuring 2.5 cm. Right paratracheal adenopathy identified with a node measuring 1.3 cm. Additional smaller mediastinal nodes. No axillary adenopathy. There is diffuse esophageal thickening and inflammatory or neoplastic process can not be excluded on this examination. Lungs/Pleura: Dense alveolar consolidation noted in the right upper lobe. Most likely etiology is pneumonia. Follow up is necessary to ensure there is no underlying lesion present. Upper Abdomen: No acute abnormality. Musculoskeletal: No chest wall abnormality. No acute or significant osseous findings. Review of the MIP images confirms the above findings. IMPRESSION: 1. No PE, aneurysm or dissection. 2. Right hilar and mediastinal adenopathy which could be reactive, and follow up recommended. 3. Dense alveolar consolidation, most likely pneumonia, right upper lobe. Follow up Recommended to exclude underlying lesions. 4. Nonspecific diffuse esophageal wall thickening for which endoscopic follow-up should be considered. Electronically Signed   By: Sammie Bench M.D.   On: 03/08/2022 09:43   DG Chest Port 1 View  Result Date: 03/08/2022 CLINICAL DATA:  New onset chest pain today. EXAM: PORTABLE CHEST 1 VIEW COMPARISON:  None Available. FINDINGS: The heart size and mediastinal contours are within normal limits. Aortic atherosclerotic calcification incidentally noted. Mild elevation of left hemidiaphragm is noted. Both lungs are clear. IMPRESSION: No active disease. Electronically Signed   By: Marlaine Hind M.D.   On: 03/08/2022 08:18   (Echo, Carotid, EGD, Colonoscopy, ERCP)    Subjective: No complaints  Discharge Exam: Vitals:    03/09/22 2051 03/10/22 0631  BP: 129/76 (!) 149/68  Pulse: 90 (!) 44  Resp: 18 16  Temp: 98.2 F (36.8 C) 97.7 F (36.5 C)  SpO2: 93% 96%   Vitals:   03/09/22 0901 03/09/22 1833 03/09/22 2051 03/10/22 0631  BP: (!) 144/46 (!) 160/73 129/76 (!) 149/68  Pulse: (!) 48 (!) 52 90 (!) 44  Resp: '18  18 16  '$ Temp: 98 F (36.7 C) 98.1 F (36.7 C) 98.2 F (36.8 C) 97.7 F (36.5 C)  TempSrc: Oral Oral Oral Oral  SpO2: 99% 95% 93% 96%  Weight:      Height:        General: Pt is alert, awake, not in acute distress Cardiovascular: RRR, S1/S2 +, no rubs, no gallops Respiratory: CTA bilaterally, no wheezing, no rhonchi Abdominal: Soft, NT, ND, bowel sounds + Extremities: no edema, no cyanosis    The results of significant diagnostics from this hospitalization (including imaging, microbiology, ancillary and laboratory) are listed below for reference.     Microbiology: Recent Results (from the past 240 hour(s))  Resp panel by RT-PCR (RSV, Flu A&B, Covid) Anterior Nasal Swab     Status: Abnormal   Collection Time: 03/08/22  7:50 AM   Specimen: Anterior Nasal Swab  Result Value Ref Range Status   SARS Coronavirus 2 by RT PCR POSITIVE (A) NEGATIVE Final    Comment: (NOTE) SARS-CoV-2 target nucleic acids are DETECTED.  The SARS-CoV-2 RNA is generally detectable in upper respiratory specimens during the acute phase of infection. Positive results are indicative of the presence of the  identified virus, but do not rule out bacterial infection or co-infection with other pathogens not detected by the test. Clinical correlation with patient history and other diagnostic information is necessary to determine patient infection status. The expected result is Negative.  Fact Sheet for Patients: EntrepreneurPulse.com.au  Fact Sheet for Healthcare Providers: IncredibleEmployment.be  This test is not yet approved or cleared by the [place] FDA and  has  been authorized for detection and/or diagnosis of SARS-CoV-2 by FDA under an Emergency Use Authorization (EUA).  This EUA will remain in effect (meaning this test can be used) for the duration of  the COVID-19 declaration under Section 564(b)(1) of the A ct, 21 U.S.C. section 360bbb-3(b)(1), unless the authorization is terminated or revoked sooner.     Influenza A by PCR NEGATIVE NEGATIVE Final   Influenza B by PCR NEGATIVE NEGATIVE Final    Comment: (NOTE) The Xpert Xpress SARS-CoV-2/FLU/RSV plus assay is intended as an aid in the diagnosis of influenza from Nasopharyngeal swab specimens and should not be used as a sole basis for treatment. Nasal washings and aspirates are unacceptable for Xpert Xpress SARS-CoV-2/FLU/RSV testing.  Fact Sheet for Patients: EntrepreneurPulse.com.au  Fact Sheet for Healthcare Providers: IncredibleEmployment.be  This test is not yet approved or cleared by the [place] FDA and has been authorized for detection and/or diagnosis of SARS-CoV-2 by FDA under an Emergency Use Authorization (EUA). This EUA will remain in effect (meaning this test can be used) for the duration of the COVID-19 declaration under Section 564(b)(1) of the Act, 21 U.S.C. section 360bbb-3(b)(1), unless the authorization is terminated or revoked.     Resp Syncytial Virus by PCR POSITIVE (A) NEGATIVE Final    Comment: (NOTE) Fact Sheet for Patients: EntrepreneurPulse.com.au  Fact Sheet for Healthcare Providers: IncredibleEmployment.be  This test is not yet approved or cleared by the [place] FDA and has been authorized for detection and/or diagnosis of SARS-CoV-2 by FDA under an Emergency Use Authorization (EUA). This EUA will remain in effect (meaning this test can be used) for the duration of the COVID-19 declaration under Section 564(b)(1) of the Act, 21 U.S.C. section 360bbb-3(b)(1), unless  the authorization is terminated or revoked.  Performed at [facility], [address], [place], [place] [address]   Blood culture (routine x 2)     Status: None (Preliminary result)   Collection Time: 03/08/22  8:40 AM   Specimen: BLOOD  Result Value Ref Range Status   Specimen Description   Final    BLOOD RIGHT ANTECUBITAL Performed at [facility], [address], [place], [place] [address]    Special Requests   Final    BOTTLES DRAWN AEROBIC AND ANAEROBIC Blood Culture adequate volume Performed at [facility], [address], [place], [place] [address]    Culture   Final    NO GROWTH < 24 HOURS Performed at [facility], [place] [address], [place], [place] [address]    Report Status PENDING  Incomplete  Blood culture (routine x 2)     Status: None (Preliminary result)   Collection Time: 03/08/22  8:43 AM   Specimen: BLOOD LEFT FOREARM  Result Value Ref Range Status   Specimen Description   Final    BLOOD LEFT FOREARM Performed at [facility], [address], [place], [place] [address]    Special Requests   Final    BOTTLES DRAWN AEROBIC AND ANAEROBIC Blood Culture adequate volume Performed at [facility], [place], [place], [place] [address]    Culture   Final    NO GROWTH < 24 HOURS Performed at [facility], [place] [address], [place], [place] [address]    Report Status PENDING  Incomplete  Urine Culture     Status: None   Collection Time: 03/08/22  8:43 AM   Specimen: Urine, Clean Catch  Result Value Ref Range Status   Specimen Description   Final    URINE, CLEAN CATCH Performed at [facility], [address] [place], [place] [address]    Special Requests   Final    NONE Performed at [facility], [address] [place], [place] [address]    Culture   Final    NO GROWTH Performed at [facility], [place] [address], [place], [place] [address]    Report Status 03/09/2022 FINAL  Final  MRSA Next Gen by PCR, Nasal     Status: None   Collection Time: 03/08/22  2:31 PM   Specimen: Nasal Mucosa; Nasal Swab  Result Value Ref Range Status   MRSA by PCR Next Gen NOT DETECTED NOT DETECTED Final    Comment: (NOTE) The GeneXpert MRSA Assay (FDA approved for NASAL specimens only), is one component of a comprehensive MRSA colonization surveillance program. It is not intended to diagnose MRSA infection nor to guide or monitor treatment for MRSA infections. Test performance is not FDA approved in patients less than 61 years old. Performed at [facility], [place] [address], [place], [place] [address]      Labs: BNP (last 3 results) Recent Labs    03/08/22 0749  BNP 834.1*   Basic Metabolic Panel: Recent Labs  Lab 03/08/22 1614 03/08/22 1636 03/08/22 2144 03/09/22 0247 03/09/22 0257 03/09/22 1056 03/10/22 0509  NA 138  --  134*  --  134* 134* 133*  K 3.7  --  3.3*  --  3.5 3.2* 3.7  CL 105  --  101  --  100 101 101  CO2 24  --  23  --  23 23 21*  GLUCOSE 115*  --  127*  --  120* 185* 98  BUN 14  --  12  --  '12 13 12  '$ CREATININE 0.91  --  0.84  --  0.83 0.69 0.77  CALCIUM 8.8*  --  8.7*  --  8.7* 8.3* 8.5*  MG  --  2.0  --  2.2  --   --  2.1  PHOS  --  2.7  --  2.6  --   --  3.7   Liver Function Tests: Recent Labs  Lab 03/08/22 0749 03/08/22 1614 03/09/22 1056 03/10/22 0509  AST 11* 12* 18 18  ALT '12 15 17 20  '$ ALKPHOS 84 67 80 75  BILITOT 1.3* 1.0 0.8 0.8  PROT 7.9 6.3* 6.2* 6.6  ALBUMIN 4.2 3.1* 3.0* 2.9*   No results for input(s): "LIPASE", "AMYLASE" in the last 168 hours. No results for input(s): "AMMONIA" in the last 168 hours. CBC: Recent Labs  Lab 03/08/22 0749 03/08/22 1014 03/09/22 0247 03/10/22 0509  WBC 28.4*  --  22.7* 12.2*  NEUTROABS 24.2*  --  18.8* 8.8*  HGB 14.9 13.6 13.7 13.7  HCT 46.9 40.0 42.6 44.2  MCV 88.5  --   88.0 90.9  PLT 295  --  246 244   Cardiac Enzymes: No results for input(s): "CKTOTAL", "CKMB", "CKMBINDEX", "TROPONINI" in the last 168 hours. BNP:  Invalid input(s): "POCBNP" CBG: Recent Labs  Lab 03/09/22 0755 03/09/22 1121 03/09/22 1606 03/09/22 2112 03/10/22 0723  GLUCAP 107* 168* 111* 162* 94   D-Dimer Recent Labs    03/09/22 0247  DDIMER 0.86*   Hgb A1c No results for input(s): "HGBA1C" in the last 72 hours. Lipid Profile No results for input(s): "CHOL", "HDL", "LDLCALC", "TRIG", "CHOLHDL", "LDLDIRECT" in the last 72 hours. Thyroid function studies No results for input(s): "TSH", "T4TOTAL", "T3FREE", "THYROIDAB" in the last 72 hours.  Invalid input(s): "FREET3" Anemia work up Recent Labs    03/09/22 0247  FERRITIN 393*   Urinalysis    Component Value Date/Time   COLORURINE YELLOW 03/08/2022 [place] 03/08/2022 0843   LABSPEC 1.028 03/08/2022 0843   PHURINE 5.0 03/08/2022 0843   GLUCOSEU >1,000 (A) 03/08/2022 0843   HGBUR TRACE (A) 03/08/2022 0843   BILIRUBINUR NEGATIVE 03/08/2022 0843   KETONESUR >80 (A) 03/08/2022 0843   PROTEINUR TRACE (A) 03/08/2022 0843   NITRITE NEGATIVE 03/08/2022 0843   LEUKOCYTESUR NEGATIVE 03/08/2022 0843   Sepsis Labs Recent Labs  Lab 03/08/22 0749 03/09/22 0247 03/10/22 0509  WBC 28.4* 22.7* 12.2*   Microbiology Recent Results (from the past 240 hour(s))  Resp panel by RT-PCR (RSV, Flu A&B, Covid) Anterior Nasal Swab     Status: Abnormal   Collection Time: 03/08/22  7:50 AM   Specimen: Anterior Nasal Swab  Result Value Ref Range Status   SARS Coronavirus 2 by RT PCR POSITIVE (A) NEGATIVE Final    Comment: (NOTE) SARS-CoV-2 target nucleic acids are DETECTED.  The SARS-CoV-2 RNA is generally detectable in upper respiratory specimens during the acute phase of infection. Positive results are indicative of the presence of the identified virus, but do not rule out bacterial infection or co-infection  with other pathogens not detected by the test. Clinical correlation with patient history and other diagnostic information is necessary to determine patient infection status. The expected result is Negative.  Fact Sheet for Patients: EntrepreneurPulse.com.au  Fact Sheet for Healthcare Providers: IncredibleEmployment.be  This test is not yet approved or cleared by the [place] FDA and  has been authorized for detection and/or diagnosis of SARS-CoV-2 by FDA under an Emergency Use Authorization (EUA).  This EUA will remain in effect (meaning this test can be used) for the duration of  the COVID-19 declaration under Section 564(b)(1) of the A ct, 21 U.S.C. section 360bbb-3(b)(1), unless the authorization is terminated or revoked sooner.     Influenza A by PCR NEGATIVE NEGATIVE Final   Influenza B by PCR NEGATIVE NEGATIVE Final    Comment: (NOTE) The Xpert Xpress SARS-CoV-2/FLU/RSV plus assay is intended as an aid in the diagnosis of influenza from Nasopharyngeal swab specimens and should not be used as a sole basis for treatment. Nasal washings and aspirates are unacceptable for Xpert Xpress SARS-CoV-2/FLU/RSV testing.  Fact Sheet for Patients: EntrepreneurPulse.com.au  Fact Sheet for Healthcare Providers: IncredibleEmployment.be  This test is not yet approved or cleared by the [place] FDA and has been authorized for detection and/or diagnosis of SARS-CoV-2 by FDA under an Emergency Use Authorization (EUA). This EUA will remain in effect (meaning this test can be used) for the duration of the COVID-19 declaration under Section 564(b)(1) of the Act, 21 U.S.C. section 360bbb-3(b)(1), unless the authorization is terminated or revoked.     Resp Syncytial Virus by PCR POSITIVE (A) NEGATIVE Final    Comment: (NOTE) Fact Sheet for Patients: EntrepreneurPulse.com.au  Fact Sheet for  Healthcare Providers: IncredibleEmployment.be  This test is not yet approved or cleared by the [place] and has been authorized for detection and/or diagnosis of SARS-CoV-2 by FDA under an Emergency Use Authorization (EUA). This EUA will remain in effect (meaning this test can be used) for the duration of the COVID-19 declaration under Section 564(b)(1) of the Act, 21 U.S.C. section 360bbb-3(b)(1), unless the authorization is terminated or revoked.  Performed at [facility], [address] [place], [place] [address]   Blood culture (routine x 2)     Status: None (Preliminary result)   Collection Time: 03/08/22  8:40 AM   Specimen: BLOOD  Result Value Ref Range Status   Specimen Description   Final    BLOOD RIGHT ANTECUBITAL Performed at [facility], [address] [place], [place] [address]    Special Requests   Final    BOTTLES DRAWN AEROBIC AND ANAEROBIC Blood Culture adequate volume Performed at [facility], [address], [place], [place] [address]    Culture   Final    NO GROWTH < 24 HOURS Performed at [facility], [place] [address], [place], [place] [address]    Report Status PENDING  Incomplete  Blood culture (routine x 2)     Status: None (Preliminary result)   Collection Time: 03/08/22  8:43 AM   Specimen: BLOOD LEFT FOREARM  Result Value Ref Range Status   Specimen Description   Final    BLOOD LEFT FOREARM Performed at [facility], [address] [place], [place] [address]    Special Requests   Final    BOTTLES DRAWN AEROBIC AND ANAEROBIC Blood Culture adequate volume Performed at [facility], [address] [place], [place] [address]    Culture   Final    NO GROWTH < 24 HOURS Performed at [facility], [place] [address], [place], [place] [address]    Report Status PENDING  Incomplete  Urine Culture     Status: None    Collection Time: 03/08/22  8:43 AM   Specimen: Urine, Clean Catch  Result Value Ref Range Status   Specimen Description   Final    URINE, CLEAN CATCH Performed at [facility], [address] [place], [place] [address]    Special Requests   Final    NONE Performed at [facility], [address] [place], [place] [address]    Culture   Final    NO GROWTH Performed at [facility], [place] [address] [place], [place] [address]    Report Status 03/09/2022 FINAL  Final  MRSA Next Gen by PCR, Nasal     Status: None   Collection Time: 03/08/22  2:31 PM   Specimen: Nasal Mucosa; Nasal Swab  Result Value Ref Range Status   MRSA by PCR Next Gen NOT DETECTED NOT DETECTED Final    Comment: (NOTE) The GeneXpert MRSA Assay (FDA approved for NASAL specimens only), is one component of a comprehensive MRSA colonization surveillance program. It is not intended to diagnose MRSA infection nor to guide or monitor treatment for MRSA infections. Test performance is not FDA approved in patients less than 72 years old. Performed at [facility], [place] [address] [place], [place] [address]      Time coordinating discharge: Over 30 minutes  SIGNED:   Charlynne Cousins, MD  [facility] 03/10/2022, 8:29 AM Pager   If 7PM-7AM, please contact night-coverage [email or web address] Password [email or web address]

## 2022-03-10 NOTE — Plan of Care (Signed)
  Problem: Education: Goal: Knowledge of General Education information will improve Description: Including pain rating scale, medication(s)/side effects and non-pharmacologic comfort measures Outcome: Adequate for Discharge   Problem: Health Behavior/Discharge Planning: Goal: Ability to manage health-related needs will improve Outcome: Adequate for Discharge   Problem: Clinical Measurements: Goal: Ability to maintain clinical measurements within normal limits will improve Outcome: Adequate for Discharge Goal: Will remain free from infection Outcome: Adequate for Discharge Goal: Diagnostic test results will improve Outcome: Adequate for Discharge Goal: Respiratory complications will improve Outcome: Adequate for Discharge Goal: Cardiovascular complication will be avoided Outcome: Adequate for Discharge   Problem: Activity: Goal: Risk for activity intolerance will decrease Outcome: Adequate for Discharge   Problem: Nutrition: Goal: Adequate nutrition will be maintained Outcome: Adequate for Discharge   Problem: Coping: Goal: Level of anxiety will decrease Outcome: Adequate for Discharge   Problem: Elimination: Goal: Will not experience complications related to bowel motility Outcome: Adequate for Discharge Goal: Will not experience complications related to urinary retention Outcome: Adequate for Discharge   Problem: Pain Managment: Goal: General experience of comfort will improve Outcome: Adequate for Discharge   Problem: Safety: Goal: Ability to remain free from injury will improve Outcome: Adequate for Discharge   Problem: Skin Integrity: Goal: Risk for impaired skin integrity will decrease Outcome: Adequate for Discharge   Problem: Education: Goal: Knowledge of risk factors and measures for prevention of condition will improve Outcome: Adequate for Discharge   Problem: Coping: Goal: Psychosocial and spiritual needs will be supported Outcome: Adequate for  Discharge   Problem: Respiratory: Goal: Will maintain a patent airway Outcome: Adequate for Discharge Goal: Complications related to the disease process, condition or treatment will be avoided or minimized Outcome: Adequate for Discharge   Problem: Education: Goal: Ability to describe self-care measures that may prevent or decrease complications (Diabetes Survival Skills Education) will improve Outcome: Adequate for Discharge Goal: Individualized Educational Video(s) Outcome: Adequate for Discharge   Problem: Coping: Goal: Ability to adjust to condition or change in health will improve Outcome: Adequate for Discharge   Problem: Fluid Volume: Goal: Ability to maintain a balanced intake and output will improve Outcome: Adequate for Discharge   Problem: Health Behavior/Discharge Planning: Goal: Ability to identify and utilize available resources and services will improve Outcome: Adequate for Discharge Goal: Ability to manage health-related needs will improve Outcome: Adequate for Discharge   Problem: Metabolic: Goal: Ability to maintain appropriate glucose levels will improve Outcome: Adequate for Discharge   Problem: Nutritional: Goal: Maintenance of adequate nutrition will improve Outcome: Adequate for Discharge Goal: Progress toward achieving an optimal weight will improve Outcome: Adequate for Discharge   Problem: Skin Integrity: Goal: Risk for impaired skin integrity will decrease Outcome: Adequate for Discharge   Problem: Tissue Perfusion: Goal: Adequacy of tissue perfusion will improve Outcome: Adequate for Discharge

## 2022-03-13 LAB — CULTURE, BLOOD (ROUTINE X 2)
Culture: NO GROWTH
Culture: NO GROWTH
Special Requests: ADEQUATE
Special Requests: ADEQUATE

## 2022-03-18 DIAGNOSIS — H9191 Unspecified hearing loss, right ear: Secondary | ICD-10-CM | POA: Diagnosis not present

## 2022-03-18 DIAGNOSIS — I10 Essential (primary) hypertension: Secondary | ICD-10-CM | POA: Diagnosis not present

## 2022-03-18 DIAGNOSIS — E1165 Type 2 diabetes mellitus with hyperglycemia: Secondary | ICD-10-CM | POA: Diagnosis not present

## 2022-03-18 DIAGNOSIS — H7291 Unspecified perforation of tympanic membrane, right ear: Secondary | ICD-10-CM | POA: Diagnosis not present

## 2022-03-18 DIAGNOSIS — J339 Nasal polyp, unspecified: Secondary | ICD-10-CM | POA: Diagnosis not present

## 2022-03-18 DIAGNOSIS — Z09 Encounter for follow-up examination after completed treatment for conditions other than malignant neoplasm: Secondary | ICD-10-CM | POA: Diagnosis not present

## 2022-05-06 DIAGNOSIS — J342 Deviated nasal septum: Secondary | ICD-10-CM | POA: Diagnosis not present

## 2022-05-06 DIAGNOSIS — H93293 Other abnormal auditory perceptions, bilateral: Secondary | ICD-10-CM | POA: Diagnosis not present

## 2022-05-06 DIAGNOSIS — J343 Hypertrophy of nasal turbinates: Secondary | ICD-10-CM | POA: Diagnosis not present

## 2022-06-01 DIAGNOSIS — M25561 Pain in right knee: Secondary | ICD-10-CM | POA: Diagnosis not present

## 2022-06-01 DIAGNOSIS — M25562 Pain in left knee: Secondary | ICD-10-CM | POA: Diagnosis not present

## 2022-06-18 DIAGNOSIS — E7800 Pure hypercholesterolemia, unspecified: Secondary | ICD-10-CM | POA: Diagnosis not present

## 2022-06-18 DIAGNOSIS — I10 Essential (primary) hypertension: Secondary | ICD-10-CM | POA: Diagnosis not present

## 2022-06-18 DIAGNOSIS — E1165 Type 2 diabetes mellitus with hyperglycemia: Secondary | ICD-10-CM | POA: Diagnosis not present

## 2022-07-02 DIAGNOSIS — J342 Deviated nasal septum: Secondary | ICD-10-CM | POA: Diagnosis not present

## 2022-07-02 DIAGNOSIS — J343 Hypertrophy of nasal turbinates: Secondary | ICD-10-CM | POA: Diagnosis not present

## 2022-07-02 DIAGNOSIS — H903 Sensorineural hearing loss, bilateral: Secondary | ICD-10-CM | POA: Diagnosis not present

## 2022-10-16 DIAGNOSIS — I10 Essential (primary) hypertension: Secondary | ICD-10-CM | POA: Diagnosis not present

## 2022-10-16 DIAGNOSIS — E1165 Type 2 diabetes mellitus with hyperglycemia: Secondary | ICD-10-CM | POA: Diagnosis not present

## 2022-10-16 DIAGNOSIS — E7800 Pure hypercholesterolemia, unspecified: Secondary | ICD-10-CM | POA: Diagnosis not present

## 2022-10-16 DIAGNOSIS — Z0000 Encounter for general adult medical examination without abnormal findings: Secondary | ICD-10-CM | POA: Diagnosis not present

## 2023-01-12 DIAGNOSIS — M25561 Pain in right knee: Secondary | ICD-10-CM | POA: Diagnosis not present

## 2023-01-12 DIAGNOSIS — M25562 Pain in left knee: Secondary | ICD-10-CM | POA: Diagnosis not present

## 2023-04-20 DIAGNOSIS — E1165 Type 2 diabetes mellitus with hyperglycemia: Secondary | ICD-10-CM | POA: Diagnosis not present

## 2023-04-20 DIAGNOSIS — E7800 Pure hypercholesterolemia, unspecified: Secondary | ICD-10-CM | POA: Diagnosis not present

## 2023-04-20 DIAGNOSIS — I10 Essential (primary) hypertension: Secondary | ICD-10-CM | POA: Diagnosis not present

## 2023-04-20 DIAGNOSIS — R011 Cardiac murmur, unspecified: Secondary | ICD-10-CM | POA: Diagnosis not present

## 2023-05-14 DIAGNOSIS — R011 Cardiac murmur, unspecified: Secondary | ICD-10-CM | POA: Diagnosis not present

## 2023-05-28 DIAGNOSIS — R195 Other fecal abnormalities: Secondary | ICD-10-CM | POA: Diagnosis not present

## 2023-05-28 DIAGNOSIS — I350 Nonrheumatic aortic (valve) stenosis: Secondary | ICD-10-CM | POA: Diagnosis not present

## 2023-05-28 DIAGNOSIS — R110 Nausea: Secondary | ICD-10-CM | POA: Diagnosis not present

## 2023-07-02 DIAGNOSIS — H5203 Hypermetropia, bilateral: Secondary | ICD-10-CM | POA: Diagnosis not present

## 2023-07-02 DIAGNOSIS — E119 Type 2 diabetes mellitus without complications: Secondary | ICD-10-CM | POA: Diagnosis not present

## 2023-07-02 DIAGNOSIS — H25813 Combined forms of age-related cataract, bilateral: Secondary | ICD-10-CM | POA: Diagnosis not present

## 2023-07-02 DIAGNOSIS — H52203 Unspecified astigmatism, bilateral: Secondary | ICD-10-CM | POA: Diagnosis not present

## 2023-07-09 DIAGNOSIS — E1165 Type 2 diabetes mellitus with hyperglycemia: Secondary | ICD-10-CM | POA: Diagnosis not present

## 2023-07-09 DIAGNOSIS — I499 Cardiac arrhythmia, unspecified: Secondary | ICD-10-CM | POA: Diagnosis not present

## 2023-07-09 DIAGNOSIS — I10 Essential (primary) hypertension: Secondary | ICD-10-CM | POA: Diagnosis not present

## 2023-11-02 DIAGNOSIS — I10 Essential (primary) hypertension: Secondary | ICD-10-CM | POA: Diagnosis not present

## 2023-11-02 DIAGNOSIS — Z1211 Encounter for screening for malignant neoplasm of colon: Secondary | ICD-10-CM | POA: Diagnosis not present

## 2023-11-02 DIAGNOSIS — Z0000 Encounter for general adult medical examination without abnormal findings: Secondary | ICD-10-CM | POA: Diagnosis not present

## 2023-11-02 DIAGNOSIS — E1165 Type 2 diabetes mellitus with hyperglycemia: Secondary | ICD-10-CM | POA: Diagnosis not present

## 2023-11-02 DIAGNOSIS — E7800 Pure hypercholesterolemia, unspecified: Secondary | ICD-10-CM | POA: Diagnosis not present

## 2023-11-02 DIAGNOSIS — Z23 Encounter for immunization: Secondary | ICD-10-CM | POA: Diagnosis not present

## 2023-12-23 DIAGNOSIS — M25561 Pain in right knee: Secondary | ICD-10-CM | POA: Diagnosis not present

## 2023-12-23 DIAGNOSIS — M25562 Pain in left knee: Secondary | ICD-10-CM | POA: Diagnosis not present
